# Patient Record
Sex: Male | Born: 1965 | Race: White | Hispanic: No | Smoking: Never smoker
Health system: Southern US, Community
[De-identification: ages and names within clinical notes are randomized; demographics above are authoritative.]

## PROBLEM LIST (undated history)

## (undated) DIAGNOSIS — F329 Major depressive disorder, single episode, unspecified: Secondary | ICD-10-CM

## (undated) DIAGNOSIS — F32A Depression, unspecified: Secondary | ICD-10-CM

## (undated) DIAGNOSIS — R569 Unspecified convulsions: Secondary | ICD-10-CM

## (undated) HISTORY — DX: Unspecified convulsions: R56.9

## (undated) HISTORY — PX: NO PAST SURGERIES: SHX2092

---

## 2004-04-01 ENCOUNTER — Emergency Department (HOSPITAL_COMMUNITY): Admission: EM | Admit: 2004-04-01 | Discharge: 2004-04-01 | Payer: Self-pay | Admitting: Family Medicine

## 2006-06-03 ENCOUNTER — Inpatient Hospital Stay (HOSPITAL_COMMUNITY): Admission: RE | Admit: 2006-06-03 | Discharge: 2006-06-06 | Payer: Self-pay | Admitting: Psychiatry

## 2006-06-04 ENCOUNTER — Ambulatory Visit: Payer: Self-pay | Admitting: Psychiatry

## 2006-06-25 ENCOUNTER — Ambulatory Visit (HOSPITAL_COMMUNITY): Payer: Self-pay | Admitting: Psychiatry

## 2006-07-16 ENCOUNTER — Ambulatory Visit (HOSPITAL_COMMUNITY): Payer: Self-pay | Admitting: Psychiatry

## 2006-07-30 ENCOUNTER — Ambulatory Visit (HOSPITAL_COMMUNITY): Payer: Self-pay | Admitting: Psychiatry

## 2006-09-17 ENCOUNTER — Ambulatory Visit (HOSPITAL_COMMUNITY): Payer: Self-pay | Admitting: Psychiatry

## 2011-01-15 ENCOUNTER — Other Ambulatory Visit: Payer: Self-pay | Admitting: Family Medicine

## 2011-01-15 ENCOUNTER — Ambulatory Visit
Admission: RE | Admit: 2011-01-15 | Discharge: 2011-01-15 | Disposition: A | Discharge status: Home / Self Care | Payer: BC Managed Care – PPO | Source: Ambulatory Visit | Attending: Family Medicine | Admitting: Family Medicine

## 2011-01-15 DIAGNOSIS — R05 Cough: Secondary | ICD-10-CM

## 2011-06-09 ENCOUNTER — Ambulatory Visit (HOSPITAL_COMMUNITY)
Admission: RE | Admit: 2011-06-09 | Discharge: 2011-06-09 | Disposition: A | Discharge status: Home / Self Care | Payer: BC Managed Care – PPO | Source: Ambulatory Visit | Attending: Psychiatry | Admitting: Psychiatry

## 2013-10-06 ENCOUNTER — Observation Stay (HOSPITAL_COMMUNITY)
Admission: EM | Admit: 2013-10-06 | Discharge: 2013-10-08 | Disposition: A | Discharge status: Home / Self Care | Payer: BC Managed Care – PPO | Attending: Internal Medicine | Admitting: Internal Medicine

## 2013-10-06 ENCOUNTER — Encounter (HOSPITAL_COMMUNITY): Payer: Self-pay | Admitting: Emergency Medicine

## 2013-10-06 ENCOUNTER — Emergency Department (HOSPITAL_COMMUNITY): Payer: BC Managed Care – PPO

## 2013-10-06 DIAGNOSIS — G8384 Todd's paralysis (postepileptic): Secondary | ICD-10-CM | POA: Diagnosis present

## 2013-10-06 DIAGNOSIS — M545 Low back pain, unspecified: Secondary | ICD-10-CM | POA: Diagnosis present

## 2013-10-06 DIAGNOSIS — R55 Syncope and collapse: Secondary | ICD-10-CM

## 2013-10-06 DIAGNOSIS — Z23 Encounter for immunization: Secondary | ICD-10-CM | POA: Insufficient documentation

## 2013-10-06 DIAGNOSIS — R200 Anesthesia of skin: Secondary | ICD-10-CM

## 2013-10-06 DIAGNOSIS — R569 Unspecified convulsions: Secondary | ICD-10-CM | POA: Diagnosis present

## 2013-10-06 DIAGNOSIS — R079 Chest pain, unspecified: Principal | ICD-10-CM | POA: Diagnosis present

## 2013-10-06 DIAGNOSIS — R209 Unspecified disturbances of skin sensation: Secondary | ICD-10-CM | POA: Insufficient documentation

## 2013-10-06 DIAGNOSIS — Z139 Encounter for screening, unspecified: Secondary | ICD-10-CM

## 2013-10-06 DIAGNOSIS — G8389 Other specified paralytic syndromes: Secondary | ICD-10-CM | POA: Insufficient documentation

## 2013-10-06 HISTORY — DX: Major depressive disorder, single episode, unspecified: F32.9

## 2013-10-06 HISTORY — DX: Depression, unspecified: F32.A

## 2013-10-06 LAB — POCT I-STAT, CHEM 8
HCT: 43 % (ref 39.0–52.0)
Hemoglobin: 14.6 g/dL (ref 13.0–17.0)
Potassium: 3.5 mEq/L (ref 3.5–5.1)
Sodium: 143 mEq/L (ref 135–145)
TCO2: 24 mmol/L (ref 0–100)

## 2013-10-06 LAB — HEPATIC FUNCTION PANEL
AST: 25 U/L (ref 0–37)
Albumin: 3.9 g/dL (ref 3.5–5.2)
Alkaline Phosphatase: 48 U/L (ref 39–117)
Bilirubin, Direct: 0.1 mg/dL (ref 0.0–0.3)
Total Bilirubin: 0.5 mg/dL (ref 0.3–1.2)

## 2013-10-06 LAB — TROPONIN I: Troponin I: <0.3 ng/mL (ref <0.30)

## 2013-10-06 LAB — CBC WITH DIFFERENTIAL/PLATELET
Basophils Relative: 0 % (ref 0–1)
Eosinophils Absolute: 0.3 10*3/uL (ref 0.0–0.7)
Eosinophils Relative: 2 % (ref 0–5)
Hemoglobin: 15.6 g/dL (ref 13.0–17.0)
Lymphs Abs: 1.7 10*3/uL (ref 0.7–4.0)
MCH: 32.8 pg (ref 26.0–34.0)
MCHC: 37.2 g/dL — ABNORMAL HIGH (ref 30.0–36.0)
Neutro Abs: 9 10*3/uL — ABNORMAL HIGH (ref 1.7–7.7)
Neutrophils Relative %: 77 % (ref 43–77)
Platelets: 216 10*3/uL (ref 150–400)
RBC: 4.76 MIL/uL (ref 4.22–5.81)
RDW: 12.5 % (ref 11.5–15.5)

## 2013-10-06 LAB — POCT I-STAT TROPONIN I

## 2013-10-06 LAB — PROTIME-INR
INR: 1.11 (ref 0.00–1.49)
Prothrombin Time: 14.1 seconds (ref 11.6–15.2)

## 2013-10-06 MED ORDER — SODIUM CHLORIDE 0.9 % IV SOLN
Freq: Once | INTRAVENOUS | Status: AC
  Administered 2013-10-06: 22:00:00 via INTRAVENOUS

## 2013-10-06 MED ORDER — MORPHINE SULFATE 4 MG/ML IJ SOLN
4.0000 mg | Freq: Once | INTRAMUSCULAR | Status: AC
  Administered 2013-10-06: 4 mg via INTRAVENOUS
  Filled 2013-10-06: qty 1

## 2013-10-06 MED ORDER — ONDANSETRON HCL 4 MG/2ML IJ SOLN
4.0000 mg | Freq: Once | INTRAMUSCULAR | Status: AC
  Administered 2013-10-06: 4 mg via INTRAVENOUS
  Filled 2013-10-06: qty 2
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
## 2013-10-07 ENCOUNTER — Observation Stay (HOSPITAL_COMMUNITY): Payer: BC Managed Care – PPO

## 2013-10-07 ENCOUNTER — Encounter (HOSPITAL_COMMUNITY): Payer: Self-pay | Admitting: Internal Medicine

## 2013-10-07 DIAGNOSIS — R209 Unspecified disturbances of skin sensation: Secondary | ICD-10-CM

## 2013-10-07 DIAGNOSIS — R55 Syncope and collapse: Secondary | ICD-10-CM

## 2013-10-07 DIAGNOSIS — R569 Unspecified convulsions: Secondary | ICD-10-CM | POA: Diagnosis present

## 2013-10-07 DIAGNOSIS — M545 Low back pain, unspecified: Secondary | ICD-10-CM | POA: Diagnosis present

## 2013-10-07 DIAGNOSIS — G8384 Todd's paralysis (postepileptic): Secondary | ICD-10-CM | POA: Diagnosis present

## 2013-10-07 DIAGNOSIS — R079 Chest pain, unspecified: Secondary | ICD-10-CM

## 2013-10-07 LAB — LIPID PANEL: HDL: 42 mg/dL (ref >39)

## 2013-10-07 LAB — RAPID URINE DRUG SCREEN, HOSP PERFORMED
Amphetamines: NOT DETECTED
Barbiturates: NOT DETECTED
Cocaine: NOT DETECTED
Opiates: POSITIVE — AB
Tetrahydrocannabinol: NOT DETECTED

## 2013-10-07 LAB — TROPONIN I
Troponin I: <0.3 ng/mL (ref <0.30)
Troponin I: <0.3 ng/mL (ref <0.30)

## 2013-10-07 LAB — CK: Total CK: 157 U/L (ref 7–232)

## 2013-10-07 LAB — HEMOGLOBIN A1C
Hgb A1c MFr Bld: 4.9 % (ref <5.7)
Mean Plasma Glucose: 94 mg/dL (ref <117)

## 2013-10-07 MED ORDER — INFLUENZA VAC SPLIT QUAD 0.5 ML IM SUSP
0.5000 mL | INTRAMUSCULAR | Status: AC
  Administered 2013-10-08: 0.5 mL via INTRAMUSCULAR
  Filled 2013-10-07: qty 0.5

## 2013-10-07 MED ORDER — ACETAMINOPHEN 325 MG PO TABS
650.0000 mg | ORAL_TABLET | ORAL | Status: DC | PRN
  Administered 2013-10-07: 650 mg via ORAL
  Filled 2013-10-07 (×2): qty 2

## 2013-10-07 MED ORDER — DIPHENHYDRAMINE HCL 50 MG/ML IJ SOLN: 12.5000 mg | Freq: Once | INTRAMUSCULAR | Status: DC

## 2013-10-07 MED ORDER — MORPHINE SULFATE 2 MG/ML IJ SOLN
1.0000 mg | INTRAMUSCULAR | Status: DC | PRN
  Administered 2013-10-07 (×3): 1 mg via INTRAVENOUS
  Filled 2013-10-07 (×3): qty 1

## 2013-10-07 MED ORDER — HYDROCODONE-ACETAMINOPHEN 5-325 MG PO TABS: 1.0000 | ORAL_TABLET | ORAL | Status: DC | PRN

## 2013-10-07 MED ORDER — ASPIRIN 325 MG PO TABS
325.0000 mg | ORAL_TABLET | Freq: Every day | ORAL | Status: DC
  Administered 2013-10-08: 325 mg via ORAL
  Filled 2013-10-07 (×2): qty 1

## 2013-10-07 MED ORDER — SODIUM CHLORIDE 0.9 % IV SOLN
INTRAVENOUS | Status: AC
  Administered 2013-10-07 (×2): via INTRAVENOUS

## 2013-10-07 MED ORDER — GADOBENATE DIMEGLUMINE 529 MG/ML IV SOLN
20.0000 mL | Freq: Once | INTRAVENOUS | Status: AC
  Administered 2013-10-07: 20 mL via INTRAVENOUS

## 2013-10-07 MED ORDER — KETOROLAC TROMETHAMINE 30 MG/ML IJ SOLN
30.0000 mg | Freq: Once | INTRAMUSCULAR | Status: AC
  Administered 2013-10-08: 30 mg via INTRAVENOUS
  Filled 2013-10-07: qty 1

## 2013-10-07 MED ORDER — SERTRALINE HCL 100 MG PO TABS
100.0000 mg | ORAL_TABLET | Freq: Every evening | ORAL | Status: DC
  Administered 2013-10-07: 100 mg via ORAL
  Filled 2013-10-07 (×2): qty 1

## 2013-10-07 NOTE — Progress Notes (Signed)
No further events while in the hospital. Does state he had a period of tingling in right cheek, he describes as 5 separate spots that lasted for about one hour. Currently he feels at baseline and is concerned about receiving his home dose of Zoloft. EEG has been done and reading pending. MRI pending. Will continue to monitor.   Felicie Morn PA-C [facility] [phone]  10/07/2013, 3:58 PM
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
## 2013-10-07 NOTE — Progress Notes (Signed)
SLP Cancellation Note  Patient Details Name: Christopher Middleton MRN: [id number] DOB: [birth year]   Cancelled treatment:       Reason Eval/Treat Not Completed: Patient at procedure or test/unavailable; SLP will follow up as able.  Fae Pippin, M.A., CCC-SLP [phone]  Reynard Christoffersen 10/07/2013, 10:50 AM
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
## 2013-10-07 NOTE — Progress Notes (Signed)
EEG Completed; Results Pending  
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
## 2013-10-08 DIAGNOSIS — I519 Heart disease, unspecified: Secondary | ICD-10-CM

## 2013-10-08 DIAGNOSIS — G8190 Hemiplegia, unspecified affecting unspecified side: Secondary | ICD-10-CM

## 2013-10-08 DIAGNOSIS — R569 Unspecified convulsions: Secondary | ICD-10-CM

## 2013-10-08 MED ORDER — LEVETIRACETAM 500 MG PO TABS: 500.0000 mg | ORAL_TABLET | Freq: Two times a day (BID) | ORAL | Status: DC

## 2013-10-08 MED ORDER — ASPIRIN 81 MG PO TABS: 81.0000 mg | ORAL_TABLET | Freq: Every day | ORAL | Status: DC

## 2013-10-08 MED ORDER — LEVETIRACETAM 500 MG PO TABS
500.0000 mg | ORAL_TABLET | Freq: Two times a day (BID) | ORAL | Status: DC
  Administered 2013-10-08: 500 mg via ORAL
  Filled 2013-10-08 (×2): qty 1

## 2013-10-08 NOTE — ED Provider Notes (Signed)
Medical screening examination/treatment/procedure(s) were conducted as a shared visit with non-physician practitioner(s) and myself.  I personally evaluated the patient during the encounter.  EKG Interpretation     Ventricular Rate:    PR Interval:    QRS Duration:   QT Interval:    QTC Calculation:   R Axis:     Text Interpretation:               Shon Baton, MD 10/08/13 1150
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
## 2013-10-08 NOTE — Progress Notes (Signed)
Discharge orders received, pt for discharge home today.  IV D/C. .  D/C instructions and Rx given with verbalized understanding.  Family at bedside to assist pt with discharge. Staff brought pt downstairs via wheelchair.  
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
## 2013-10-08 NOTE — Progress Notes (Signed)
Echocardiogram 2D Echocardiogram has been performed.  Dorothey Baseman 10/08/2013, 12:00 PM

## 2013-10-22 NOTE — Consult Note (Signed)
October 22, 2013    To Whom It May Concern:  This letter is to serve as verification that Quintyn Dombek was admitted for medically necessary inpatient care at [facility] from October 06, 2013 through October 08, 2013.  Mr. Angert was released to return to work on November 1 with the following restrictions.  Mr. Vandergrift is unable to drive, operate heavy machinery, perform activities at heights, or participate in water activities until he is released by an outpatient physician.  If you have any questions or need additional information, please contact my Administrative Office at [phone].  Sincerely,    Marinda Elk, M.D. [facility] [place]

## 2013-10-25 ENCOUNTER — Encounter: Payer: Self-pay | Admitting: Neurology

## 2013-10-25 ENCOUNTER — Ambulatory Visit (INDEPENDENT_AMBULATORY_CARE_PROVIDER_SITE_OTHER): Payer: BC Managed Care – PPO | Admitting: Neurology

## 2013-10-25 VITALS — BP 115/70 | HR 67 | Ht 70.0 in | Wt 194.0 lb

## 2013-10-25 DIAGNOSIS — G8384 Todd's paralysis (postepileptic): Secondary | ICD-10-CM

## 2013-10-25 DIAGNOSIS — M545 Low back pain: Secondary | ICD-10-CM

## 2013-10-25 DIAGNOSIS — R569 Unspecified convulsions: Secondary | ICD-10-CM

## 2013-10-25 DIAGNOSIS — G8190 Hemiplegia, unspecified affecting unspecified side: Secondary | ICD-10-CM

## 2013-10-25 DIAGNOSIS — R079 Chest pain, unspecified: Secondary | ICD-10-CM

## 2013-10-25 MED ORDER — LEVETIRACETAM 500 MG PO TABS: 500.0000 mg | ORAL_TABLET | Freq: Two times a day (BID) | ORAL | Status: DC
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
## 2013-10-25 NOTE — Patient Instructions (Signed)
No driving till seizure free x 6 months, seizure was in 10/06/2013.

## 2014-04-25 ENCOUNTER — Ambulatory Visit (INDEPENDENT_AMBULATORY_CARE_PROVIDER_SITE_OTHER): Payer: BC Managed Care – PPO | Admitting: Nurse Practitioner

## 2014-04-25 ENCOUNTER — Encounter: Payer: Self-pay | Admitting: Nurse Practitioner

## 2014-04-25 ENCOUNTER — Encounter (INDEPENDENT_AMBULATORY_CARE_PROVIDER_SITE_OTHER): Payer: Self-pay

## 2014-04-25 VITALS — BP 145/97 | HR 76 | Ht 70.0 in | Wt 193.0 lb

## 2014-04-25 DIAGNOSIS — G40209 Localization-related (focal) (partial) symptomatic epilepsy and epileptic syndromes with complex partial seizures, not intractable, without status epilepticus: Secondary | ICD-10-CM

## 2014-04-25 DIAGNOSIS — R569 Unspecified convulsions: Secondary | ICD-10-CM

## 2014-04-25 NOTE — Patient Instructions (Signed)
Continue Keppra 500 mg twice daily every 12 hours Call for further seizure activity Followup in 6 months

## 2014-04-25 NOTE — Progress Notes (Signed)
I have read the note, and I agree with the clinical assessment and plan.  Christopher Middleton   
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
## 2014-05-12 ENCOUNTER — Other Ambulatory Visit: Payer: Self-pay

## 2014-05-12 MED ORDER — LEVETIRACETAM 500 MG PO TABS: 500.0000 mg | ORAL_TABLET | Freq: Two times a day (BID) | ORAL | Status: DC

## 2014-10-26 ENCOUNTER — Ambulatory Visit (INDEPENDENT_AMBULATORY_CARE_PROVIDER_SITE_OTHER): Payer: BC Managed Care – PPO | Admitting: Nurse Practitioner

## 2014-10-26 ENCOUNTER — Encounter: Payer: Self-pay | Admitting: Nurse Practitioner

## 2014-10-26 VITALS — BP 112/74 | HR 69 | Ht 70.0 in | Wt 196.0 lb

## 2014-10-26 DIAGNOSIS — G40209 Localization-related (focal) (partial) symptomatic epilepsy and epileptic syndromes with complex partial seizures, not intractable, without status epilepticus: Secondary | ICD-10-CM

## 2014-10-26 DIAGNOSIS — G8384 Todd's paralysis (postepileptic): Secondary | ICD-10-CM

## 2014-10-26 MED ORDER — LEVETIRACETAM 500 MG PO TABS: 500.0000 mg | ORAL_TABLET | Freq: Two times a day (BID) | ORAL | Status: DC
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
## 2014-10-26 NOTE — Patient Instructions (Signed)
Continue Keppra 500 mg twice daily will refill Call for any seizure activity Follow-up yearly and when necessary

## 2014-11-01 ENCOUNTER — Other Ambulatory Visit: Payer: Self-pay

## 2014-11-01 ENCOUNTER — Encounter: Payer: Self-pay | Admitting: Nurse Practitioner

## 2014-11-01 MED ORDER — LEVETIRACETAM 500 MG PO TABS: 500.0000 mg | ORAL_TABLET | Freq: Two times a day (BID) | ORAL | Status: DC

## 2014-11-08 NOTE — Progress Notes (Signed)
I agree above plan. 

## 2015-10-30 ENCOUNTER — Ambulatory Visit: Payer: BC Managed Care – PPO | Admitting: Nurse Practitioner

## 2015-10-31 ENCOUNTER — Encounter: Payer: Self-pay | Admitting: Nurse Practitioner

## 2015-11-09 ENCOUNTER — Encounter: Payer: Self-pay | Admitting: Nurse Practitioner

## 2015-11-09 ENCOUNTER — Ambulatory Visit (INDEPENDENT_AMBULATORY_CARE_PROVIDER_SITE_OTHER): Payer: BLUE CROSS/BLUE SHIELD | Admitting: Nurse Practitioner

## 2015-11-09 VITALS — BP 137/85 | HR 68 | Ht 71.5 in | Wt 190.0 lb

## 2015-11-09 DIAGNOSIS — G40209 Localization-related (focal) (partial) symptomatic epilepsy and epileptic syndromes with complex partial seizures, not intractable, without status epilepticus: Secondary | ICD-10-CM | POA: Diagnosis not present

## 2015-11-09 MED ORDER — LEVETIRACETAM 500 MG PO TABS: 500.0000 mg | ORAL_TABLET | Freq: Two times a day (BID) | ORAL | Status: DC

## 2015-11-09 NOTE — Patient Instructions (Signed)
Continue Keppra at current dose will refill Call for seizure activity F/U yearly  
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
## 2015-11-09 NOTE — Progress Notes (Signed)
I have reviewed and agreed above plan. 

## 2016-04-11 ENCOUNTER — Encounter: Payer: Self-pay | Admitting: Pulmonary Disease

## 2016-04-11 ENCOUNTER — Ambulatory Visit (INDEPENDENT_AMBULATORY_CARE_PROVIDER_SITE_OTHER): Payer: BLUE CROSS/BLUE SHIELD | Admitting: Pulmonary Disease

## 2016-04-11 VITALS — BP 118/78 | HR 81 | Ht 71.5 in | Wt 197.0 lb

## 2016-04-11 DIAGNOSIS — G4733 Obstructive sleep apnea (adult) (pediatric): Secondary | ICD-10-CM | POA: Diagnosis not present

## 2016-04-11 DIAGNOSIS — G4710 Hypersomnia, unspecified: Secondary | ICD-10-CM | POA: Diagnosis not present

## 2016-04-11 NOTE — Assessment & Plan Note (Addendum)
Pt has hypersomnia, snoring.  Has gasping, choking, witnessed apneas, frequent awakenings.  Sleeps for 7 hrs.  Has unrefreshed sleep. Has hypersomnia. Hypersomnia affects fxnality.  (-) abnormal behavior in sleep.   He had a sleep study in 2011 which showed AHI < 1, REM AHI 8. He did not have a good experience with the sleep study. Difficulty falling and staying asleep.   ESS 12.  Very symptomatic at work.   Plan : 1. HST. Likely with moderate-severe OSA. If (+), will try autocpap. Anticipate no issues with cpap. 2. Father in law uses mouth guard. We discussed about this. Another option if ever. 3. If sleep study is (-), will need to see sooner. Pt is on sze meds which can cause hypersomnia. Pt also may have anxiety issues > not sure. Can affect hypersomnia.

## 2016-04-11 NOTE — Patient Instructions (Signed)
1. We will order you a home sleep study. If that is positive, we will start you on auto CPAP 5-15 cm water. 2. Give us a call if you don't hear back from us regarding study report and CPAP.  Return to clinic in August.

## 2016-04-11 NOTE — Progress Notes (Signed)
Subjective:    Patient ID: Christopher Middleton, male    DOB: [birth year], 50 y.o.   MRN: [id number]  HPI   This is the case of Christopher Middleton, 50 y.o. Male, who was referred by Dr. Blair Heys  in consultation regarding hypersomnia.   As you very well know, patient is a non smoker. Smokes cigars.  Not been dxed with asthma or copd. Has sze d/o since 2015. Stable. On meds. Sees neurology.   Pt has hypersomnia, snoring.  Has gasping, choking, witnessed apneas, frequent awakenings.  Sleeps for 7 hrs.  Has unrefreshed sleep. Has hypersomnia. Hypersomnia affects fxnality.  (-) abnormal behavior in sleep.   He had a sleep study in 2011. AHI was <1, REM AHI 8.  Pt had difficulty staying falling and staying asleep during that sleep study.  He did NOT have a good experience with the lab study.         Review of Systems  Constitutional: Negative.  Negative for fever and unexpected weight change.  HENT: Positive for congestion, postnasal drip and sinus pressure. Negative for dental problem, ear pain, nosebleeds, rhinorrhea, sneezing, sore throat and trouble swallowing.   Eyes: Positive for redness. Negative for itching.  Respiratory: Positive for cough. Negative for chest tightness, shortness of breath and wheezing.   Cardiovascular: Negative.  Negative for palpitations and leg swelling.  Gastrointestinal: Negative.  Negative for nausea and vomiting.  Endocrine: Negative.   Genitourinary: Negative.  Negative for dysuria.  Musculoskeletal: Positive for arthralgias. Negative for joint swelling.  Skin: Negative.  Negative for rash.  Allergic/Immunologic: Negative.   Neurological: Negative.  Negative for headaches.  Hematological: Negative.  Does not bruise/bleed easily.  Psychiatric/Behavioral: Negative.  Negative for dysphoric mood. The patient is not nervous/anxious.    Past Medical History  Diagnosis Date  . Depression   . Seizures (HCC)    (-) CA, DVT  Family History    Problem Relation Age of Onset  . Diabetes Mellitus II Mother   . Prostate cancer Paternal Grandfather   . Stroke Maternal Grandfather      Past Surgical History  Procedure Laterality Date  . No past surgeries      Social History   Social History  . Marital Status: Married    Spouse Name: Aggie Cosier  . Number of Children: 3  . Years of Education: 16+   Occupational History  . Not on file.   Social History Main Topics  . Smoking status: Never Smoker   . Smokeless tobacco: Never Used  . Alcohol Use: 0.0 oz/week    0 Standard drinks or equivalent per week     Comment: 3-4 week  . Drug Use: No  . Sexual Activity: Not on file   Other Topics Concern  . Not on file   Social History Narrative   Patient lives at home with his wife Aggie Cosier) and 3 children.   Patient works at [employer].   Patient has a Masters   Patient is ambi-dextrous.   Patient drinks 1-2 cups of coffee daily.  Drinks etoh occasionally.  Does desk job. Gets sleepy at work.     No Known Allergies   Outpatient Prescriptions Prior to Visit  Medication Sig Dispense Refill  . aspirin 81 MG tablet Take 1 tablet (81 mg total) by mouth daily. 30 tablet 0  . levETIRAcetam (KEPPRA) 500 MG tablet Take 1 tablet (500 mg total) by mouth 2 (two) times daily. 180 tablet 3  . sertraline (ZOLOFT) 100  MG tablet Take 100 mg by mouth every evening.     No facility-administered medications prior to visit.   No orders of the defined types were placed in this encounter.          Objective:   Physical Exam  Vitals:  Filed Vitals:   04/11/16 1109  BP: 118/78  Pulse: 81  Height: 5' 11.5" (1.816 m)  Weight: 197 lb (89.359 kg)  SpO2: 96%    Constitutional/General:  Pleasant, well-nourished, well-developed, not in any distress,  Comfortably seating.  Well kempt  Body mass index is 27.1 kg/(m^2). Wt Readings from Last 3 Encounters:  04/11/16 197 lb (89.359 kg)  11/09/15 190 lb (86.183 kg)  10/26/14 196 lb (88.905  kg)    Neck circumference: 16 inches  HEENT: Pupils equal and reactive to light and accommodation. Anicteric sclerae. Normal nasal mucosa.   No oral  lesions,  mouth clear,  oropharynx clear, no postnasal drip. (-) Oral thrush. No dental caries.  Airway - Mallampati class III  Neck: No masses. Midline trachea. No JVD, (-) LAD. (-) bruits appreciated.  Respiratory/Chest: Grossly normal chest. (-) deformity. (-) Accessory muscle use.  Symmetric expansion. (-) Tenderness on palpation.  Resonant on percussion.  Diminished BS on both lower lung zones. (-) wheezing, crackles, rhonchi (-) egophony  Cardiovascular: Regular rate and  rhythm, heart sounds normal, no murmur or gallops, no peripheral edema  Gastrointestinal:  Normal bowel sounds. Soft, non-tender. No hepatosplenomegaly.  (-) masses.   Musculoskeletal:  Normal muscle tone. Normal gait.   Extremities: Grossly normal. (-) clubbing, cyanosis.  (-) edema  Skin: (-) rash,lesions seen.   Neurological/Psychiatric : alert, oriented to time, place, person. Normal mood and affect            Assessment & Plan:  Hypersomnia Pt has hypersomnia, snoring.  Has gasping, choking, witnessed apneas, frequent awakenings.  Sleeps for 7 hrs.  Has unrefreshed sleep. Has hypersomnia. Hypersomnia affects fxnality.  (-) abnormal behavior in sleep.   He had a sleep study in 2011 which showed AHI < 1, REM AHI 8. He did not have a good experience with the sleep study. Difficulty falling and staying asleep.   ESS 12.  Very symptomatic at work.   Plan : 1. HST. Likely with moderate-severe OSA. If (+), will try autocpap. Anticipate no issues with cpap. 2. Father in law uses mouth guard. We discussed about this. Another option if ever. 3. If sleep study is (-), will need to see sooner. Pt is on sze meds which can cause hypersomnia.        Thank you very much for letting me participate in this patient's care. Please do not  hesitate to give me a call if you have any questions or concerns regarding the treatment plan.   Patient will follow up with me in August, sooner if with issues.     Pollie MeyerJ. Angelo A. de Dios, MD 04/11/2016   12:16 PM Pulmonary and Critical Care Medicine [facility] Pager: [phone] Office: [phone], Fax: [phone]

## 2016-05-02 ENCOUNTER — Encounter: Payer: Self-pay | Admitting: Pulmonary Disease

## 2016-05-07 DIAGNOSIS — G4733 Obstructive sleep apnea (adult) (pediatric): Secondary | ICD-10-CM | POA: Diagnosis not present

## 2016-05-16 ENCOUNTER — Telehealth: Payer: Self-pay | Admitting: Pulmonary Disease

## 2016-05-16 DIAGNOSIS — G4733 Obstructive sleep apnea (adult) (pediatric): Secondary | ICD-10-CM

## 2016-05-16 NOTE — Telephone Encounter (Signed)
  Please call the pt and tell the pt the HOME SLEEP STUDY  showed OSA.   Pt stops breathing  17  times an hour.   Please order autoCPAP 5-15 cm H2O. Patient will need a mask fitting session. Patient will need a 1 month download.   Patient needs to be seen by me (if possible)  or any of the NPs/APPs  6-8  weeks after obtaining the cpap machine. Let me know if you receive this.   Thanks!   J. Alexis FrockAngelo A de Dios, MD 05/16/2016, 4:11 PM

## 2016-05-17 ENCOUNTER — Other Ambulatory Visit: Payer: Self-pay | Admitting: *Deleted

## 2016-05-17 DIAGNOSIS — G4733 Obstructive sleep apnea (adult) (pediatric): Secondary | ICD-10-CM

## 2016-05-27 NOTE — Telephone Encounter (Signed)
Spoke with pt and gave results and recommendations. Pt agrees to start CPAP therapy. Order placed. Pt aware to call office and schedule f/u appt once starts CPAP. Nothing further needed.  

## 2016-06-17 ENCOUNTER — Encounter (HOSPITAL_COMMUNITY): Payer: Self-pay | Admitting: *Deleted

## 2016-06-17 ENCOUNTER — Emergency Department (HOSPITAL_COMMUNITY)
Admission: EM | Admit: 2016-06-17 | Discharge: 2016-06-17 | Disposition: A | Discharge status: Home / Self Care | Payer: BLUE CROSS/BLUE SHIELD | Attending: Emergency Medicine | Admitting: Emergency Medicine

## 2016-06-17 DIAGNOSIS — R51 Headache: Secondary | ICD-10-CM | POA: Insufficient documentation

## 2016-06-17 DIAGNOSIS — Z791 Long term (current) use of non-steroidal anti-inflammatories (NSAID): Secondary | ICD-10-CM | POA: Diagnosis not present

## 2016-06-17 DIAGNOSIS — R110 Nausea: Secondary | ICD-10-CM | POA: Insufficient documentation

## 2016-06-17 DIAGNOSIS — Z79899 Other long term (current) drug therapy: Secondary | ICD-10-CM | POA: Insufficient documentation

## 2016-06-17 DIAGNOSIS — F329 Major depressive disorder, single episode, unspecified: Secondary | ICD-10-CM | POA: Diagnosis not present

## 2016-06-17 DIAGNOSIS — R197 Diarrhea, unspecified: Secondary | ICD-10-CM | POA: Diagnosis not present

## 2016-06-17 DIAGNOSIS — R109 Unspecified abdominal pain: Secondary | ICD-10-CM | POA: Diagnosis not present

## 2016-06-17 DIAGNOSIS — Z7982 Long term (current) use of aspirin: Secondary | ICD-10-CM | POA: Diagnosis not present

## 2016-06-17 LAB — GASTROINTESTINAL PANEL BY PCR, STOOL (REPLACES STOOL CULTURE)
Adenovirus F40/41: NOT DETECTED
Astrovirus: NOT DETECTED
CRYPTOSPORIDIUM: NOT DETECTED
CYCLOSPORA CAYETANENSIS: NOT DETECTED
Campylobacter species: NOT DETECTED
E. COLI O157: NOT DETECTED
ENTAMOEBA HISTOLYTICA: NOT DETECTED
ENTEROAGGREGATIVE E COLI (EAEC): NOT DETECTED
Enteropathogenic E coli (EPEC): DETECTED — AB
Enterotoxigenic E coli (ETEC): DETECTED — AB
GIARDIA LAMBLIA: NOT DETECTED
NOROVIRUS GI/GII: NOT DETECTED
Plesimonas shigelloides: NOT DETECTED
Rotavirus A: NOT DETECTED
SALMONELLA SPECIES: NOT DETECTED
SAPOVIRUS (I, II, IV, AND V): NOT DETECTED
SHIGELLA/ENTEROINVASIVE E COLI (EIEC): NOT DETECTED
Shiga like toxin producing E coli (STEC): NOT DETECTED
VIBRIO CHOLERAE: NOT DETECTED
VIBRIO SPECIES: NOT DETECTED
YERSINIA ENTEROCOLITICA: NOT DETECTED

## 2016-06-17 LAB — URINE MICROSCOPIC-ADD ON

## 2016-06-17 LAB — COMPREHENSIVE METABOLIC PANEL
ALT: 33 U/L (ref 17–63)
AST: 24 U/L (ref 15–41)
Albumin: 4.9 g/dL (ref 3.5–5.0)
Alkaline Phosphatase: 66 U/L (ref 38–126)
Anion gap: 9 (ref 5–15)
BUN: 17 mg/dL (ref 6–20)
CHLORIDE: 104 mmol/L (ref 101–111)
CO2: 26 mmol/L (ref 22–32)
CREATININE: 1.42 mg/dL — AB (ref 0.61–1.24)
Calcium: 9.3 mg/dL (ref 8.9–10.3)
GFR calc non Af Amer: 56 mL/min — ABNORMAL LOW (ref >60)
Glucose, Bld: 103 mg/dL — ABNORMAL HIGH (ref 65–99)
POTASSIUM: 4 mmol/L (ref 3.5–5.1)
SODIUM: 139 mmol/L (ref 135–145)
Total Bilirubin: 1.2 mg/dL (ref 0.3–1.2)
Total Protein: 8.6 g/dL — ABNORMAL HIGH (ref 6.5–8.1)

## 2016-06-17 LAB — URINALYSIS, ROUTINE W REFLEX MICROSCOPIC
GLUCOSE, UA: NEGATIVE mg/dL
HGB URINE DIPSTICK: NEGATIVE
Ketones, ur: NEGATIVE mg/dL
Nitrite: NEGATIVE
PH: 5.5 (ref 5.0–8.0)
PROTEIN: 30 mg/dL — AB
SPECIFIC GRAVITY, URINE: 1.029 (ref 1.005–1.030)

## 2016-06-17 LAB — CBC
HEMATOCRIT: 44.8 % (ref 39.0–52.0)
Hemoglobin: 16.9 g/dL (ref 13.0–17.0)
MCH: 32.3 pg (ref 26.0–34.0)
MCHC: 37.7 g/dL — ABNORMAL HIGH (ref 30.0–36.0)
MCV: 85.5 fL (ref 78.0–100.0)
PLATELETS: 183 10*3/uL (ref 150–400)
RBC: 5.24 MIL/uL (ref 4.22–5.81)
RDW: 13 % (ref 11.5–15.5)
WBC: 13.3 10*3/uL — AB (ref 4.0–10.5)

## 2016-06-17 LAB — LIPASE, BLOOD: LIPASE: 26 U/L (ref 11–51)

## 2016-06-17 MED ORDER — SODIUM CHLORIDE 0.9 % IV BOLUS (SEPSIS)
2000.0000 mL | Freq: Once | INTRAVENOUS | Status: AC
  Administered 2016-06-17: 2000 mL via INTRAVENOUS

## 2016-06-17 MED ORDER — ONDANSETRON HCL 4 MG/2ML IJ SOLN
4.0000 mg | Freq: Four times a day (QID) | INTRAMUSCULAR | Status: DC | PRN
  Administered 2016-06-17: 4 mg via INTRAVENOUS

## 2016-06-17 MED ORDER — DICYCLOMINE HCL 20 MG PO TABS: 20.0000 mg | ORAL_TABLET | Freq: Two times a day (BID) | ORAL | Status: DC | PRN

## 2016-06-17 MED ORDER — DICYCLOMINE HCL 10 MG/ML IM SOLN
20.0000 mg | Freq: Once | INTRAMUSCULAR | Status: AC
  Administered 2016-06-17: 20 mg via INTRAMUSCULAR
  Filled 2016-06-17: qty 2

## 2016-06-17 MED ORDER — SODIUM CHLORIDE 0.9 % IV BOLUS (SEPSIS)
1000.0000 mL | Freq: Once | INTRAVENOUS | Status: AC
  Administered 2016-06-17: 1000 mL via INTRAVENOUS

## 2016-06-17 MED ORDER — METRONIDAZOLE IN NACL 5-0.79 MG/ML-% IV SOLN
500.0000 mg | Freq: Once | INTRAVENOUS | Status: AC
  Administered 2016-06-17: 500 mg via INTRAVENOUS
  Filled 2016-06-17: qty 100

## 2016-06-17 MED ORDER — ONDANSETRON HCL 4 MG/2ML IJ SOLN
4.0000 mg | Freq: Once | INTRAMUSCULAR | Status: DC | PRN
  Filled 2016-06-17: qty 2

## 2016-06-17 MED ORDER — OXYCODONE-ACETAMINOPHEN 5-325 MG PO TABS: 1.0000 | ORAL_TABLET | Freq: Four times a day (QID) | ORAL | Status: DC | PRN

## 2016-06-17 MED ORDER — METRONIDAZOLE 500 MG PO TABS: 500.0000 mg | ORAL_TABLET | Freq: Three times a day (TID) | ORAL | Status: DC

## 2016-06-17 MED ORDER — HYDROMORPHONE HCL 1 MG/ML IJ SOLN
0.5000 mg | Freq: Once | INTRAMUSCULAR | Status: AC
  Administered 2016-06-17: 0.5 mg via INTRAVENOUS
  Filled 2016-06-17: qty 1

## 2016-06-17 MED ORDER — CIPROFLOXACIN IN D5W 400 MG/200ML IV SOLN
400.0000 mg | Freq: Once | INTRAVENOUS | Status: AC
Start: 2016-06-17 — End: 2016-06-17
  Administered 2016-06-17: 400 mg via INTRAVENOUS
  Filled 2016-06-17: qty 200

## 2016-06-17 MED ORDER — CIPROFLOXACIN HCL 500 MG PO TABS: 500.0000 mg | ORAL_TABLET | Freq: Two times a day (BID) | ORAL | Status: DC

## 2016-06-17 NOTE — ED Provider Notes (Signed)
CSN: [id number]     Arrival date & time 06/17/16  1308 History   First MD Initiated Contact with Patient 06/17/16 [phone]     Chief Complaint  Patient presents with  . Emesis  . Diarrhea     (Consider location/radiation/quality/duration/timing/severity/associated sxs/prior Treatment) HPI Comments: 50 year old male with history of depression, seizures presents for diarrhea and abdominal pain. The patient returned from [place] about 4 days ago. He said while he was in [place] for a couple weeks he had been having loose stools but not frequent stools and denied any pain at that time. He said on Thursday the day before he came back he started to note some cramping abdominal pain. This seemed to get worse since he's returned home. He's been having very frequent, copious loose stools. This has been associated with severe cramping abdominal pain. He denies any blood in his stool. This morning he also had dry heaving. He denies actually vomiting. His symptoms have not been associated with vomiting over the last few days. He feels like anything he tries to eat or drink goes right through him and his stomach feels sick so he has not wanted to eat or drink much. Denies known fever but has been feeling hot and cold. Of note he did not sleep for 40 hours straight while returning to the [place].   Past Medical History  Diagnosis Date  . Depression   . Seizures [facility])    Past Surgical History  Procedure Laterality Date  . No past surgeries     Family History  Problem Relation Age of Onset  . Diabetes Mellitus II Mother   . Prostate cancer Paternal Grandfather   . Stroke Maternal Grandfather    Social History  Substance Use Topics  . Smoking status: Never Smoker   . Smokeless tobacco: Never Used  . Alcohol Use: 0.0 oz/week    0 Standard drinks or equivalent per week     Comment: 3-4 week    Review of Systems  Constitutional: Positive for fatigue. Negative for fever and chills.  HENT: Negative  for congestion, rhinorrhea and sinus pressure.   Eyes: Negative for visual disturbance.  Respiratory: Negative for chest tightness and shortness of breath.   Cardiovascular: Negative for chest pain and palpitations.  Gastrointestinal: Positive for nausea, abdominal pain and diarrhea. Negative for vomiting.  Genitourinary: Negative for dysuria and hematuria.  Musculoskeletal: Negative for myalgias and back pain.  Skin: Negative for rash.  Neurological: Positive for headaches. Negative for dizziness, seizures, weakness and numbness.  Hematological: Does not bruise/bleed easily.      Allergies  Review of patient's allergies indicates no known allergies.  Home Medications   Prior to Admission medications   Medication Sig Start Date End Date Taking? Authorizing Provider  aspirin 81 MG tablet Take 1 tablet (81 mg total) by mouth daily. Patient taking differently: Take 81 mg by mouth daily as needed for pain.  10/08/13  Yes Marinda Elk, MD  ibuprofen (ADVIL,MOTRIN) 200 MG tablet Take 400 mg by mouth every 6 (six) hours as needed for headache or moderate pain.   Yes Historical Provider, MD  levETIRAcetam (KEPPRA) 500 MG tablet Take 1 tablet (500 mg total) by mouth 2 (two) times daily. 11/09/15  Yes Nilda Riggs, NP  Multiple Vitamin (MULTIVITAMIN WITH MINERALS) TABS tablet Take 1 tablet by mouth daily.   Yes Historical Provider, MD  sertraline (ZOLOFT) 100 MG tablet Take 100 mg by mouth every evening.   Yes Historical Provider,  MD  ciprofloxacin (CIPRO) 500 MG tablet Take 1 tablet (500 mg total) by mouth 2 (two) times daily. 06/17/16   Leta BaptistEmily Roe Nikia Mangino, MD  dicyclomine (BENTYL) 20 MG tablet Take 1 tablet (20 mg total) by mouth 2 (two) times daily as needed for spasms. 06/17/16   Leta BaptistEmily Roe Leilynn Pilat, MD  metroNIDAZOLE (FLAGYL) 500 MG tablet Take 1 tablet (500 mg total) by mouth 3 (three) times daily. 06/17/16   Leta BaptistEmily Roe Carys Malina, MD  oxyCODONE-acetaminophen (PERCOCET/ROXICET) 5-325 MG  tablet Take 1-2 tablets by mouth every 6 (six) hours as needed for severe pain. 06/17/16   Leta BaptistEmily Roe Anissa Abbs, MD   BP 127/86 mmHg  Pulse 87  Temp(Src) 98.6 F (37 C) (Oral)  Resp 16  Ht 6' (1.829 m)  Wt 194 lb (87.998 kg)  BMI 26.31 kg/m2  SpO2 98% Physical Exam  Constitutional: He is oriented to person, place, and time. He appears well-developed and well-nourished. No distress.  HENT:  Head: Normocephalic and atraumatic.  Right Ear: External ear normal.  Left Ear: External ear normal.  Mouth/Throat: Oropharynx is clear and moist. Mucous membranes are dry. No oropharyngeal exudate.  Eyes: EOM are normal. Pupils are equal, round, and reactive to light.  Neck: Normal range of motion. Neck supple.  Cardiovascular: Normal rate, regular rhythm, normal heart sounds and intact distal pulses.   No murmur heard. Pulmonary/Chest: Effort normal. No respiratory distress. He has no wheezes. He has no rales.  Abdominal: Soft. He exhibits no distension. There is tenderness (generalized, diffuse).  Musculoskeletal: He exhibits no edema.  Neurological: He is alert and oriented to person, place, and time.  Skin: Skin is warm and dry. No rash noted. He is not diaphoretic.  Vitals reviewed.   ED Course  Procedures (including critical care time) Labs Review Labs Reviewed  GASTROINTESTINAL PANEL BY PCR, STOOL (REPLACES STOOL CULTURE) - Abnormal; Notable for the following:    Enteropathogenic E coli (EPEC) DETECTED (*)    Enterotoxigenic E coli (ETEC) DETECTED (*)    All other components within normal limits  COMPREHENSIVE METABOLIC PANEL - Abnormal; Notable for the following:    Glucose, Bld 103 (*)    Creatinine, Ser 1.42 (*)    Total Protein 8.6 (*)    GFR calc non Af Amer 56 (*)    All other components within normal limits  CBC - Abnormal; Notable for the following:    WBC 13.3 (*)    MCHC 37.7 (*)    All other components within normal limits  URINALYSIS, ROUTINE W REFLEX MICROSCOPIC (NOT  AT [facility]) - Abnormal; Notable for the following:    Color, Urine ORANGE (*)    APPearance CLOUDY (*)    Bilirubin Urine SMALL (*)    Protein, ur 30 (*)    Leukocytes, UA TRACE (*)    All other components within normal limits  URINE MICROSCOPIC-ADD ON - Abnormal; Notable for the following:    Squamous Epithelial / LPF 0-5 (*)    Bacteria, UA MANY (*)    Casts HYALINE CASTS (*)    All other components within normal limits  URINE CULTURE  LIPASE, BLOOD    Imaging Review No results found. I have personally reviewed and evaluated these images and lab results as part of my medical decision-making.   EKG Interpretation None      MDM  Patient was seen and evaluated in stable condition.  Patient with borderline temperature.  Normal blood pressure and heart rate.  Has generalized abdominal tenderness  on examination.  Patient hydrated with 3 L IV fluids and reported great improvement.  Also treated with Bentyl and dilaudid for pain control.  Continued to have loose stools in the ER but was able to tolerate drinking three cups of fluids.  He appeared stable enough for discharge and was given an initial dose of Cipro/flagyl in the ED for presumed infectious diarrhea.  He was discharged home with prescriptions for cipro/flagyl as well as percocet and bentyl.  He was given strict return precautions and instructed to follow up with his PCP tomorrow or the next day.  Patient and wife expressed understanding and agreement with plan of care. Final diagnoses:  Acute diarrhea    1. Acute diarrhea    Leta Baptist, MD 06/17/16 2202

## 2016-06-17 NOTE — ED Notes (Signed)
All IV antibiotics and fluids are completed at this time.  EPIC will not allow RN to "stop"

## 2016-06-17 NOTE — ED Notes (Signed)
PT unable to give stool sample at this time

## 2016-06-17 NOTE — Discharge Instructions (Signed)
You were seen and evaluated today for your diarrhea. Likely this is an infectious process. Please take the antibiotics prescribed. Follow-up with your primary care doctor tomorrow or the next day. Your stool cultures will hopefully have resulted by then.  Diarrhea Diarrhea is frequent loose and watery bowel movements. It can cause you to feel weak and dehydrated. Dehydration can cause you to become tired and thirsty, have a dry mouth, and have decreased urination that often is dark yellow. Diarrhea is a sign of another problem, most often an infection that will not last long. In most cases, diarrhea typically lasts 2-3 days. However, it can last longer if it is a sign of something more serious. It is important to treat your diarrhea as directed by your caregiver to lessen or prevent future episodes of diarrhea. CAUSES  Some common causes include:  Gastrointestinal infections caused by viruses, bacteria, or parasites.  Food poisoning or food allergies.  Certain medicines, such as antibiotics, chemotherapy, and laxatives.  Artificial sweeteners and fructose.  Digestive disorders. HOME CARE INSTRUCTIONS  Ensure adequate fluid intake (hydration): Have 1 cup (8 oz) of fluid for each diarrhea episode. Avoid fluids that contain simple sugars or sports drinks, fruit juices, whole milk products, and sodas. Your urine should be clear or pale yellow if you are drinking enough fluids. Hydrate with an oral rehydration solution that you can purchase at pharmacies, retail stores, and online. You can prepare an oral rehydration solution at home by mixing the following ingredients together:   - tsp table salt.   tsp baking soda.   tsp salt substitute containing potassium chloride.  1  tablespoons sugar.  1 L (34 oz) of water.  Certain foods and beverages may increase the speed at which food moves through the gastrointestinal (GI) tract. These foods and beverages should be avoided and  include:  Caffeinated and alcoholic beverages.  High-fiber foods, such as raw fruits and vegetables, nuts, seeds, and whole grain breads and cereals.  Foods and beverages sweetened with sugar alcohols, such as xylitol, sorbitol, and mannitol.  Some foods may be well tolerated and may help thicken stool including:  Starchy foods, such as rice, toast, pasta, low-sugar cereal, oatmeal, grits, baked potatoes, crackers, and bagels.  Bananas.  Applesauce.  Add probiotic-rich foods to help increase healthy bacteria in the GI tract, such as yogurt and fermented milk products.  Wash your hands well after each diarrhea episode.  Only take over-the-counter or prescription medicines as directed by your caregiver.  Take a warm bath to relieve any burning or pain from frequent diarrhea episodes. SEEK IMMEDIATE MEDICAL CARE IF:   You are unable to keep fluids down.  You have persistent vomiting.  You have blood in your stool, or your stools are black and tarry.  You do not urinate in 6-8 hours, or there is only a small amount of very dark urine.  You have abdominal pain that increases or localizes.  You have weakness, dizziness, confusion, or light-headedness.  You have a severe headache.  Your diarrhea gets worse or does not get better.  You have a fever or persistent symptoms for more than 2-3 days.  You have a fever and your symptoms suddenly get worse. MAKE SURE YOU:   Understand these instructions.  Will watch your condition.  Will get help right away if you are not doing well or get worse.   This information is not intended to replace advice given to you by your health care provider. Make sure  you discuss any questions you have with your health care provider.   Document Released: 11/15/2002 Document Revised: 12/16/2014 Document Reviewed: 08/02/2012 Elsevier Interactive Patient Education [employer]2016 Elsevier Inc.

## 2016-06-17 NOTE — ED Notes (Signed)
PT have been made aware of urine and stool sample. Will info staff when able to go

## 2016-06-17 NOTE — ED Notes (Signed)
PT states that he returned from [place] on Fri; pt c/o N/V/D since returning; pt states that he is unable to keep any fluids down; pt reports numerous episodes of diarrhea; pt also c/o headache and generalized malaise

## 2016-06-17 NOTE — ED Notes (Signed)
MD at bedside. 

## 2016-06-17 NOTE — ED Notes (Signed)
Went to draw labwork. Unable to draw labs as MD in middle of assessment.

## 2016-06-18 LAB — URINE CULTURE

## 2016-07-12 ENCOUNTER — Ambulatory Visit: Payer: BLUE CROSS/BLUE SHIELD | Admitting: Pulmonary Disease

## 2016-09-21 DIAGNOSIS — G4733 Obstructive sleep apnea (adult) (pediatric): Secondary | ICD-10-CM | POA: Diagnosis not present

## 2016-10-22 DIAGNOSIS — G4733 Obstructive sleep apnea (adult) (pediatric): Secondary | ICD-10-CM | POA: Diagnosis not present

## 2016-11-11 ENCOUNTER — Ambulatory Visit: Payer: BLUE CROSS/BLUE SHIELD | Admitting: Nurse Practitioner

## 2016-12-06 ENCOUNTER — Ambulatory Visit (INDEPENDENT_AMBULATORY_CARE_PROVIDER_SITE_OTHER): Payer: BLUE CROSS/BLUE SHIELD | Admitting: Nurse Practitioner

## 2016-12-06 ENCOUNTER — Encounter: Payer: Self-pay | Admitting: Nurse Practitioner

## 2016-12-06 VITALS — BP 125/83 | HR 63 | Ht 72.0 in | Wt 203.6 lb

## 2016-12-06 DIAGNOSIS — G40209 Localization-related (focal) (partial) symptomatic epilepsy and epileptic syndromes with complex partial seizures, not intractable, without status epilepticus: Secondary | ICD-10-CM

## 2016-12-06 MED ORDER — LEVETIRACETAM 500 MG PO TABS: 500.0000 mg | ORAL_TABLET | Freq: Two times a day (BID) | ORAL | 3 refills | Status: DC

## 2016-12-06 NOTE — Patient Instructions (Signed)
Continue Keppra at current dose will refill Call for seizure activity F/U yearly  
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
## 2016-12-12 ENCOUNTER — Other Ambulatory Visit: Payer: Self-pay | Admitting: *Deleted

## 2016-12-12 MED ORDER — LEVETIRACETAM 500 MG PO TABS: 500.0000 mg | ORAL_TABLET | Freq: Two times a day (BID) | ORAL | 3 refills | Status: DC

## 2016-12-12 NOTE — Telephone Encounter (Signed)
Received fax asking for additional information and or clarification.  Unable to verify eligibility.   Called pt and relayed that received this, no change unless they have different address.  Called and spoke to [facility] they did not have recent prescription.  I will redo.

## 2016-12-22 DIAGNOSIS — G4733 Obstructive sleep apnea (adult) (pediatric): Secondary | ICD-10-CM | POA: Diagnosis not present

## 2016-12-24 MED ORDER — LEVETIRACETAM 500 MG PO TABS: 500.0000 mg | ORAL_TABLET | Freq: Two times a day (BID) | ORAL | 3 refills | Status: DC

## 2016-12-24 NOTE — Addendum Note (Signed)
Addended byHermenia Fiscal: Javed Cotto on: 12/24/2016 05:18 PM   Modules accepted: Orders

## 2016-12-24 NOTE — Telephone Encounter (Signed)
I called [facility] and they did not have prescription.  I will redo again.  I told pt to call them and verify his address (give new) and update information.

## 2016-12-25 ENCOUNTER — Other Ambulatory Visit: Payer: Self-pay | Admitting: Nurse Practitioner

## 2017-01-02 ENCOUNTER — Other Ambulatory Visit: Payer: Self-pay | Admitting: *Deleted

## 2017-01-02 ENCOUNTER — Telehealth: Payer: Self-pay | Admitting: *Deleted

## 2017-01-02 MED ORDER — LEVETIRACETAM 500 MG PO TABS: 500.0000 mg | ORAL_TABLET | Freq: Two times a day (BID) | ORAL | 3 refills | Status: DC

## 2017-01-02 NOTE — Telephone Encounter (Signed)
[facility] called and wanted to approve refill on pt keppra, generic. Spoke with pharm tech, Shawntierre Advised CM,NP already sent rx keppra on 01/24/17 qty 180 3 refills. They never received this. I re-sent as requested while tech on phone and she verified they received while I was on the phone. Nothing further needed at this time.

## 2017-01-28 ENCOUNTER — Emergency Department (HOSPITAL_COMMUNITY): Payer: BLUE CROSS/BLUE SHIELD

## 2017-01-28 ENCOUNTER — Encounter (HOSPITAL_COMMUNITY): Payer: Self-pay | Admitting: Emergency Medicine

## 2017-01-28 ENCOUNTER — Emergency Department (HOSPITAL_COMMUNITY)
Admission: EM | Admit: 2017-01-28 | Discharge: 2017-01-28 | Disposition: A | Discharge status: Home / Self Care | Payer: BLUE CROSS/BLUE SHIELD | Attending: Emergency Medicine | Admitting: Emergency Medicine

## 2017-01-28 DIAGNOSIS — Z79899 Other long term (current) drug therapy: Secondary | ICD-10-CM | POA: Insufficient documentation

## 2017-01-28 DIAGNOSIS — Z7982 Long term (current) use of aspirin: Secondary | ICD-10-CM | POA: Insufficient documentation

## 2017-01-28 DIAGNOSIS — N200 Calculus of kidney: Secondary | ICD-10-CM | POA: Diagnosis not present

## 2017-01-28 DIAGNOSIS — R079 Chest pain, unspecified: Secondary | ICD-10-CM | POA: Diagnosis not present

## 2017-01-28 DIAGNOSIS — R0789 Other chest pain: Secondary | ICD-10-CM | POA: Diagnosis not present

## 2017-01-28 LAB — COMPREHENSIVE METABOLIC PANEL
ALK PHOS: 51 U/L (ref 38–126)
ALT: 47 U/L (ref 17–63)
ANION GAP: 8 (ref 5–15)
AST: 29 U/L (ref 15–41)
Albumin: 4.7 g/dL (ref 3.5–5.0)
BUN: 24 mg/dL — ABNORMAL HIGH (ref 6–20)
CALCIUM: 9.3 mg/dL (ref 8.9–10.3)
CO2: 25 mmol/L (ref 22–32)
Chloride: 107 mmol/L (ref 101–111)
Creatinine, Ser: 1.2 mg/dL (ref 0.61–1.24)
GFR calc Af Amer: >60 mL/min (ref >60)
GFR calc non Af Amer: >60 mL/min (ref >60)
Glucose, Bld: 105 mg/dL — ABNORMAL HIGH (ref 65–99)
POTASSIUM: 3.9 mmol/L (ref 3.5–5.1)
SODIUM: 140 mmol/L (ref 135–145)
Total Bilirubin: 0.8 mg/dL (ref 0.3–1.2)
Total Protein: 8 g/dL (ref 6.5–8.1)

## 2017-01-28 LAB — I-STAT TROPONIN, ED
TROPONIN I, POC: 0 ng/mL (ref 0.00–0.08)
Troponin i, poc: 0 ng/mL (ref 0.00–0.08)

## 2017-01-28 LAB — CBC
HCT: 42.4 % (ref 39.0–52.0)
Hemoglobin: 15.6 g/dL (ref 13.0–17.0)
MCH: 31.6 pg (ref 26.0–34.0)
MCHC: 36.8 g/dL — ABNORMAL HIGH (ref 30.0–36.0)
MCV: 85.8 fL (ref 78.0–100.0)
PLATELETS: 215 10*3/uL (ref 150–400)
RBC: 4.94 MIL/uL (ref 4.22–5.81)
RDW: 13 % (ref 11.5–15.5)
WBC: 9.3 10*3/uL (ref 4.0–10.5)

## 2017-01-28 LAB — LIPASE, BLOOD: LIPASE: 31 U/L (ref 11–51)

## 2017-01-28 MED ORDER — IOPAMIDOL (ISOVUE-370) INJECTION 76%
INTRAVENOUS | Status: AC
  Filled 2017-01-28: qty 100

## 2017-01-28 MED ORDER — IOPAMIDOL (ISOVUE-370) INJECTION 76%
100.0000 mL | Freq: Once | INTRAVENOUS | Status: AC | PRN
  Administered 2017-01-28: 100 mL via INTRAVENOUS

## 2017-01-28 MED ORDER — GI COCKTAIL ~~LOC~~
30.0000 mL | Freq: Once | ORAL | Status: AC
  Administered 2017-01-28: 30 mL via ORAL
  Filled 2017-01-28: qty 30

## 2017-01-28 MED ORDER — SUCRALFATE 1 GM/10ML PO SUSP: 1.0000 g | Freq: Three times a day (TID) | ORAL | 0 refills | Status: DC

## 2017-01-28 MED ORDER — FAMOTIDINE IN NACL 20-0.9 MG/50ML-% IV SOLN
20.0000 mg | Freq: Once | INTRAVENOUS | Status: AC
  Administered 2017-01-28: 20 mg via INTRAVENOUS
  Filled 2017-01-28: qty 50

## 2017-01-28 MED ORDER — PANTOPRAZOLE SODIUM 20 MG PO TBEC: 20.0000 mg | DELAYED_RELEASE_TABLET | Freq: Every day | ORAL | 0 refills | Status: DC

## 2017-01-28 MED ORDER — HYDROMORPHONE HCL 1 MG/ML IJ SOLN
1.0000 mg | Freq: Once | INTRAMUSCULAR | Status: AC
  Administered 2017-01-28: 1 mg via INTRAVENOUS
  Filled 2017-01-28: qty 1

## 2017-01-28 MED ORDER — ONDANSETRON HCL 4 MG/2ML IJ SOLN
4.0000 mg | Freq: Once | INTRAMUSCULAR | Status: AC
  Administered 2017-01-28: 4 mg via INTRAVENOUS
  Filled 2017-01-28: qty 2

## 2017-01-28 MED ORDER — SODIUM CHLORIDE 0.9 % IJ SOLN
INTRAMUSCULAR | Status: AC
  Filled 2017-01-28: qty 50

## 2017-01-28 MED ORDER — SODIUM CHLORIDE 0.9 % IV BOLUS (SEPSIS)
500.0000 mL | Freq: Once | INTRAVENOUS | Status: AC
  Administered 2017-01-28: 500 mL via INTRAVENOUS

## 2017-01-28 NOTE — ED Provider Notes (Signed)
[facility]-EMERGENCY DEPT Provider Note   CSN: [id number] Arrival date & time: 01/28/17  0411     History   Chief Complaint Chief Complaint  Patient presents with  . Chest Pain    HPI Christopher Middleton is a 51 y.o. male.  Patient presents to the emergency department for evaluation of chest pain. Patient reports that he was awakened at around 3 AM with back pain. He got up and walked around, felt somewhat better. He then became acutely nauseated. He went back to bed, and developed left sided chest pain. Patient continues to have pain in the left upper back area as well as pain in the left chest that is constant. He has not identified any exacerbating factors, feels better if he stands up and walks.      Past Medical History:  Diagnosis Date  . Depression   . Seizures [facility])     Patient Active Problem List   Diagnosis Date Noted  . 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 Seizure disorder, complex partial (HCC) 04/25/2014  . 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 Low back pain 10/07/2013  . Chest pain 10/07/2013  . Probable seizures 10/07/2013    Past Surgical History:  Procedure Laterality Date  . NO PAST SURGERIES         Home Medications    Prior to Admission medications   Medication Sig Start Date End Date Taking? Authorizing Provider  ibuprofen (ADVIL,MOTRIN) 200 MG tablet Take 400 mg by mouth every 6 (six) hours as needed for headache or moderate pain.   Yes Historical Provider, MD  levETIRAcetam (KEPPRA) 500 MG tablet Take 1 tablet (500 mg total) by mouth 2 (two) times daily. 01/02/17  Yes Nilda Riggs, NP  Multiple Vitamin (MULTIVITAMIN WITH MINERALS) TABS tablet Take 1 tablet by mouth daily.   Yes Historical Provider, MD  sertraline (ZOLOFT) 100 MG tablet Take 100 mg by mouth every evening.   Yes Historical Provider, MD  aspirin 81 MG tablet Take 1 tablet (81 mg total) by mouth daily. Patient not taking: Reported on 01/28/2017 10/08/13   Marinda Elk, MD    pantoprazole (PROTONIX) 20 MG tablet Take 1 tablet (20 mg total) by mouth daily. 01/28/17   Gilda Crease, MD  sucralfate (CARAFATE) 1 GM/10ML suspension Take 10 mLs (1 g total) by mouth 4 (four) times daily -  with meals and at bedtime. 01/28/17   Gilda Crease, MD    Family History Family History  Problem Relation Age of Onset  . Diabetes Mellitus II Mother   . Prostate cancer Paternal Grandfather   . Stroke Maternal Grandfather     Social History Social History  Substance Use Topics  . Smoking status: Never Smoker  . Smokeless tobacco: Never Used  . Alcohol use 0.0 oz/week     Comment: 3-4 week     Allergies   Patient has no known allergies.   Review of Systems Review of Systems  Cardiovascular: Positive for chest pain.  Musculoskeletal: Positive for back pain.  All other systems reviewed and are negative.    Physical Exam Updated Vital Signs BP 136/99 (BP Location: Left Arm)   Pulse (!) 55   Temp 98.1 F (36.7 C) (Oral)   Resp 14   Ht 6' (1.829 m)   Wt 200 lb (90.7 kg)   SpO2 100%   BMI 27.12 kg/m   Physical Exam  Constitutional: He is oriented to person, place, and time. He appears well-developed and well-nourished. He appears  distressed.  HENT:  Head: Normocephalic and atraumatic.  Right Ear: Hearing normal.  Left Ear: Hearing normal.  Nose: Nose normal.  Mouth/Throat: Oropharynx is clear and moist and mucous membranes are normal.  Eyes: Conjunctivae and EOM are normal. Pupils are equal, round, and reactive to light.  Neck: Normal range of motion. Neck supple.  Cardiovascular: Regular rhythm, S1 normal and S2 normal.  Exam reveals no gallop and no friction rub.   No murmur heard. Pulmonary/Chest: Effort normal and breath sounds normal. No respiratory distress. He exhibits no tenderness.  Abdominal: Soft. Normal appearance and bowel sounds are normal. There is no hepatosplenomegaly. There is no tenderness. There is no rebound, no  guarding, no tenderness at McBurney's point and negative Murphy's sign. No hernia.  Musculoskeletal: Normal range of motion.       Thoracic back: He exhibits tenderness.       Back:  Neurological: He is alert and oriented to person, place, and time. He has normal strength. No cranial nerve deficit or sensory deficit. Coordination normal. GCS eye subscore is 4. GCS verbal subscore is 5. GCS motor subscore is 6.  Skin: Skin is warm, dry and intact. No rash noted. No cyanosis.  Psychiatric: He has a normal mood and affect. His speech is normal and behavior is normal. Thought content normal.  Nursing note and vitals reviewed.    ED Treatments / Results  Labs (all labs ordered are listed, but only abnormal results are displayed) Labs Reviewed  CBC - Abnormal; Notable for the following:       Result Value   MCHC 36.8 (*)    All other components within normal limits  COMPREHENSIVE METABOLIC PANEL - Abnormal; Notable for the following:    Glucose, Bld 105 (*)    BUN 24 (*)    All other components within normal limits  LIPASE, BLOOD  I-STAT TROPOININ, ED  I-STAT TROPOININ, ED    EKG  EKG Interpretation  Date/Time:  Tuesday January 28 2017 04:19:41 EST Ventricular Rate:  66 PR Interval:    QRS Duration: 99 QT Interval:  387 QTC Calculation: 406 R Axis:   36 Text Interpretation:  Sinus rhythm Normal ECG Confirmed by POLLINA  MD, CHRISTOPHER ([id number]) on 01/28/2017 4:26:54 AM Also confirmed by Blinda Leatherwood  MD, CHRISTOPHER [phone]), editor [place]  CCT, BEVERLY ([id number])  on 01/28/2017 6:48:02 AM       Radiology Dg Chest 2 View  Result Date: 01/28/2017 CLINICAL DATA:  51 year old male with chest pain. EXAM: CHEST  2 VIEW COMPARISON:  Chest radiograph dated 11/15/2011 and thoracic spine radiograph dated 10/06/2013 FINDINGS: The heart size and mediastinal contours are within normal limits. Both lungs are clear. The visualized skeletal structures are unremarkable. IMPRESSION: No active  cardiopulmonary disease. Electronically Signed   By: Elgie Collard M.D.   On: 01/28/2017 05:54   Ct Angio Chest/abd/pel For Dissection W And/or Wo Contrast  Result Date: 01/28/2017 CLINICAL DATA:  Pt was awoken this a.m. With central chest pain, radiates to his back, wbc's 9.3, GFR >60, no prev ct's of chest, abdomin nor pelvis EXAM: CT ANGIOGRAPHY CHEST, ABDOMEN AND PELVIS TECHNIQUE: Multidetector CT imaging through the chest, abdomen and pelvis was performed using the standard protocol during bolus administration of intravenous contrast. Multiplanar reconstructed images and MIPs were obtained and reviewed to evaluate the vascular anatomy. CONTRAST:  100 ml isovue 370 COMPARISON:  None. FINDINGS: CTA CHEST FINDINGS Cardiovascular: Preferential opacification of the thoracic aorta. No evidence of thoracic aortic aneurysm or dissection.  Normal heart size. No pericardial effusion. Mediastinum/Nodes: No enlarged mediastinal, hilar, or axillary lymph nodes. Thyroid gland, trachea, and esophagus demonstrate no significant findings. Lungs/Pleura: Lungs are clear. No pleural effusion or pneumothorax. Dependent subsegmental atelectasis in the lower lobes. Musculoskeletal: No chest wall abnormality. No acute or significant osseous findings. Review of the MIP images confirms the above findings. CTA ABDOMEN AND PELVIS FINDINGS VASCULAR Aorta: Normal caliber aorta without aneurysm, dissection, vasculitis or significant stenosis. Celiac: Patent without evidence of aneurysm, dissection, vasculitis or significant stenosis. SMA: Patent without evidence of aneurysm, dissection, vasculitis or significant stenosis. 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 Renal arteries are patent without evidence of aneurysm, dissection, vasculitis, fibromuscular dysplasia or significant stenosis. IMA: Patent without evidence of aneurysm, dissection, vasculitis or significant stenosis. Inflow: Patent without evidence of aneurysm, dissection, vasculitis  or significant stenosis. Veins: Dedicated venous phase not obtained. Patent bilateral renal veins, splenic vein, portal vein. Review of the MIP images confirms the above findings. NON-VASCULAR Hepatobiliary: Fatty liver without focal lesion. No biliary ductal dilatation. Gallbladder nondilated without calcified stone. Pancreas: Unremarkable. No pancreatic ductal dilatation or surrounding inflammatory changes. Spleen: Normal in size without focal abnormality. Adrenals/Urinary Tract: Adrenal glands are unremarkable. 1 mm calculus, mid right renal collecting system. No hydronephrosis. No focal renal lesion. Bladder is physiologically distended, with a 1 mm calculus in its dependent portion. Stomach/Bowel: Stomach is within normal limits. Appendix appears normal. No evidence of bowel wall thickening, distention, or inflammatory changes. Lymphatic: No adenopathy localized Reproductive: Mild prostatic enlargement with central small calcification Other: No ascites.  No free air. Musculoskeletal: No acute or significant osseous findings. Review of the MIP images confirms the above findings. IMPRESSION: 1. Negative for aortic dissection or other significant vascular finding. 2. Right nephrolithiasis, and a 1 mm calculus in the lumen of the urinary bladder. 3. Fatty liver Electronically Signed   By: Corlis Leak M.D.   On: 01/28/2017 08:27    Procedures Procedures (including critical care time)  Medications Ordered in ED Medications  famotidine (PEPCID) IVPB 20 mg premix (0 mg Intravenous Stopped 01/28/17 0630)  gi cocktail (Maalox,Lidocaine,Donnatal) (30 mLs Oral Given 01/28/17 0511)  HYDROmorphone (DILAUDID) injection 1 mg (1 mg Intravenous Given 01/28/17 0511)  ondansetron (ZOFRAN) injection 4 mg (4 mg Intravenous Given 01/28/17 0512)  sodium chloride 0.9 % bolus 500 mL (0 mLs Intravenous Stopped 01/28/17 0630)  iopamidol (ISOVUE-370) 76 % injection 100 mL (100 mLs Intravenous Contrast Given 01/28/17 0755)      Initial Impression / Assessment and Plan / ED Course  I have reviewed the triage vital signs and the nursing notes.  Pertinent labs & imaging results that were available during my care of the patient were reviewed by me and considered in my medical decision making (see chart for details).     Presents with pain in the left side of his upper and middle back radiating into the chest. Patient does not have a cardiac history. He has minimal cardiac risk factors. He is felt to be low risk for cardiac etiology based on presentation and a HEART score of 1. EKG Normal, troponin normal.  Patient improved after IV Dilaudid, Pepcid, GI cocktail. Because of the presentation with chest and back pain, aortic dissection is considered. Patient underwent CT angiography of chest, abdomen, pelvis. No acute pathology was noted. Patient will be treated for reflux, follow-up with PCP.  Final Clinical Impressions(s) / ED Diagnoses   Final diagnoses:  Atypical chest pain    New Prescriptions Discharge Medication List as  of 01/28/2017  8:41 AM    START taking these medications   Details  pantoprazole (PROTONIX) 20 MG tablet Take 1 tablet (20 mg total) by mouth daily., Starting Tue 01/28/2017, Print    sucralfate (CARAFATE) 1 GM/10ML suspension Take 10 mLs (1 g total) by mouth 4 (four) times daily -  with meals and at bedtime., Starting Tue 01/28/2017, Print         Gilda Creasehristopher J Pollina, MD 01/29/17 [phone]

## 2017-01-28 NOTE — ED Triage Notes (Signed)
Awaken from sleep with central chest pain that radiates to his back

## 2017-01-31 DIAGNOSIS — Z1211 Encounter for screening for malignant neoplasm of colon: Secondary | ICD-10-CM | POA: Diagnosis not present

## 2017-01-31 DIAGNOSIS — N200 Calculus of kidney: Secondary | ICD-10-CM | POA: Diagnosis not present

## 2017-02-12 DIAGNOSIS — Z1211 Encounter for screening for malignant neoplasm of colon: Secondary | ICD-10-CM | POA: Diagnosis not present

## 2017-02-25 DIAGNOSIS — Z1211 Encounter for screening for malignant neoplasm of colon: Secondary | ICD-10-CM | POA: Diagnosis not present

## 2017-04-08 DIAGNOSIS — L309 Dermatitis, unspecified: Secondary | ICD-10-CM | POA: Diagnosis not present

## 2017-04-21 DIAGNOSIS — G4733 Obstructive sleep apnea (adult) (pediatric): Secondary | ICD-10-CM | POA: Diagnosis not present

## 2017-04-25 IMAGING — CT CT ANGIO CHEST-ABD-PELV FOR DISSECTION W/ AND WO/W CM
2 of 7 series · 15 of 46 positions shown, 17 images · IV contrast (ISOVUE 370)
Comparison: None.

CLINICAL DATA: Pt was awoken this a.m. With central chest pain,
radiates to his back, wbc's 9.3, GFR >60, no prev ct's of chest,
abdomin nor pelvis

EXAM:
CT ANGIOGRAPHY CHEST, ABDOMEN AND PELVIS
TECHNIQUE: Multidetector CT imaging through the chest, abdomen and pelvis was
performed using the standard protocol during bolus administration of
intravenous contrast. Multiplanar reconstructed images and MIPs were
obtained and reviewed to evaluate the vascular anatomy.
CONTRAST:  100 ml isovue 370

[Series 6: arterial 3.0 b30f · axial · arterial · 0.88mm/px · z∈[-322,+266]mm · 12 of 222 slices shown, 14 images]
[im 13/222  soft-tissue]
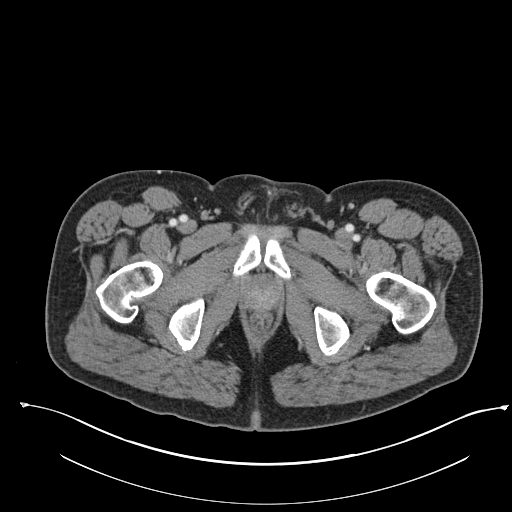
[im 13/222  bone]
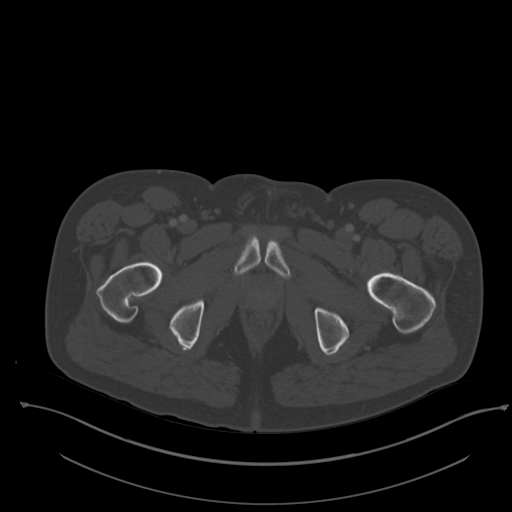
[im 37/222  soft-tissue]
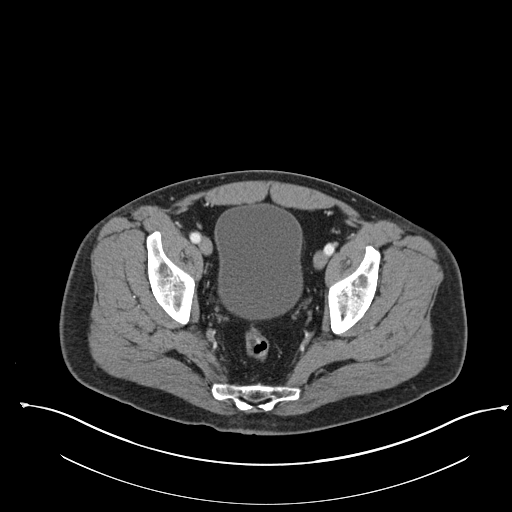
[im 50/222  soft-tissue]
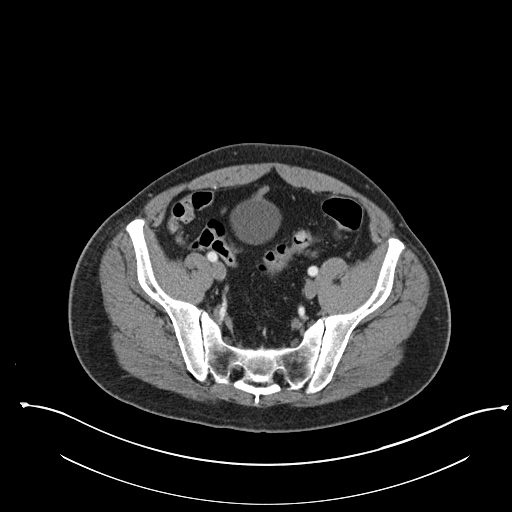
[im 62/222  soft-tissue]
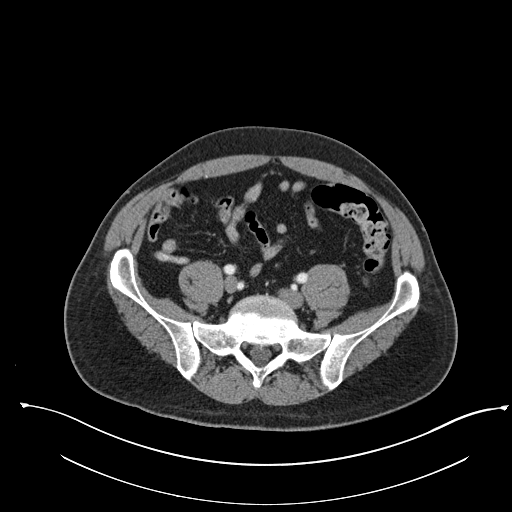
[im 86/222  soft-tissue]
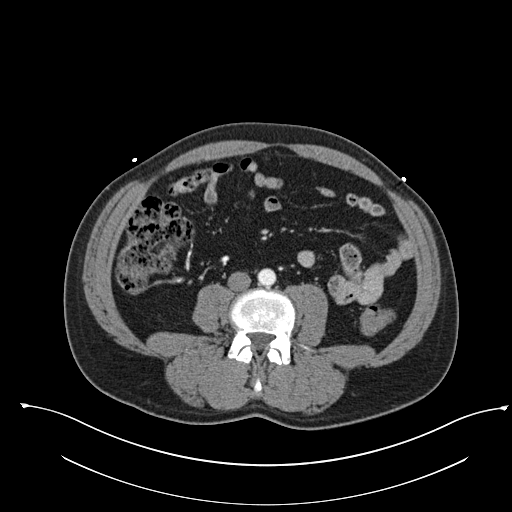
[im 99/222  soft-tissue]
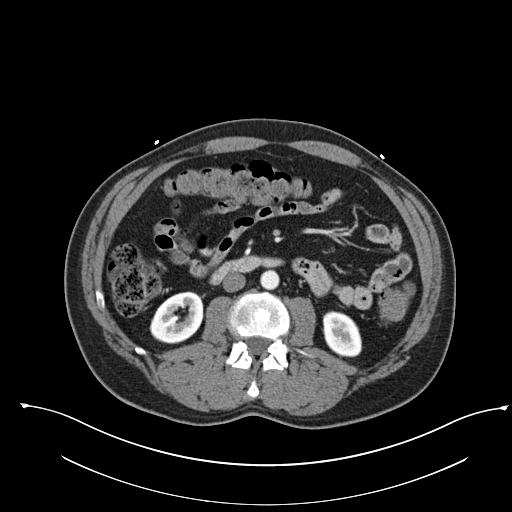
[im 123/222  soft-tissue]
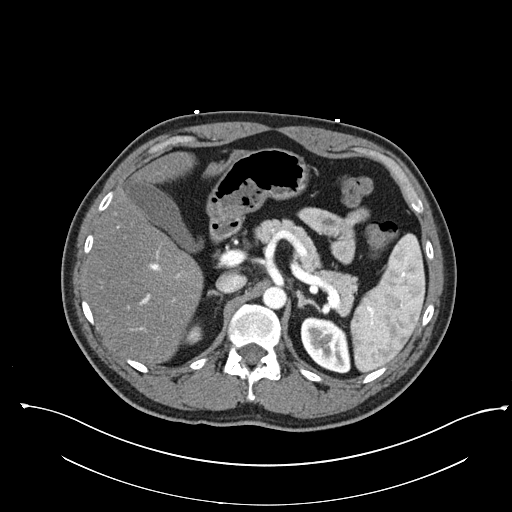
[im 136/222  soft-tissue]
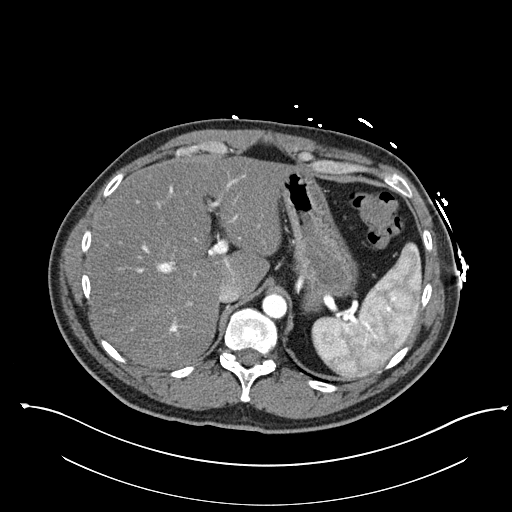
[im 160/222  soft-tissue]
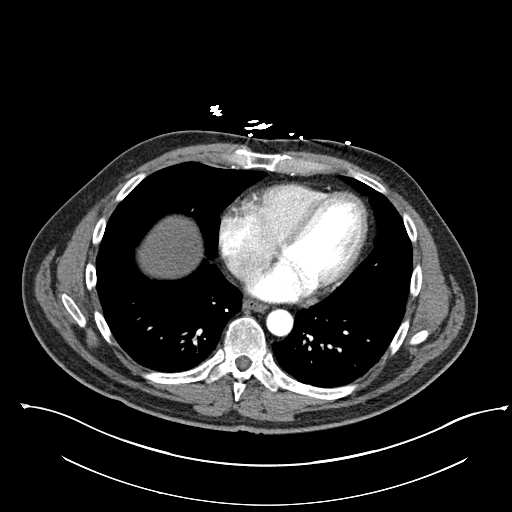
[im 160/222  bone]
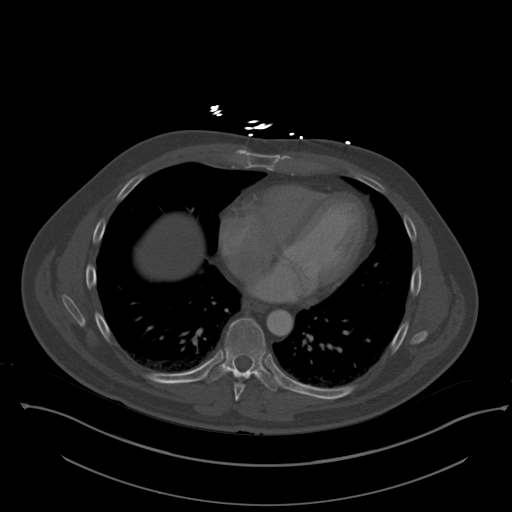
[im 172/222  soft-tissue]
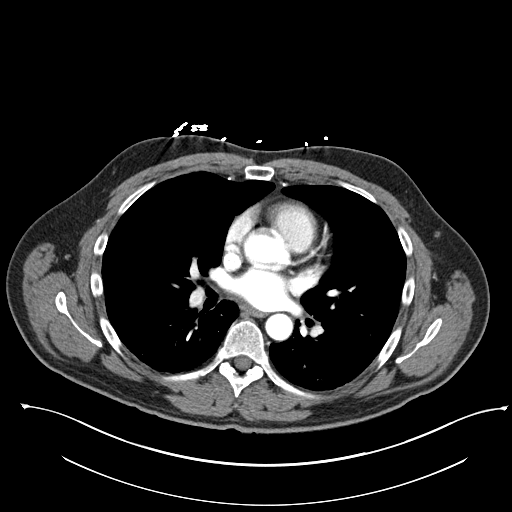
[im 185/222  soft-tissue]
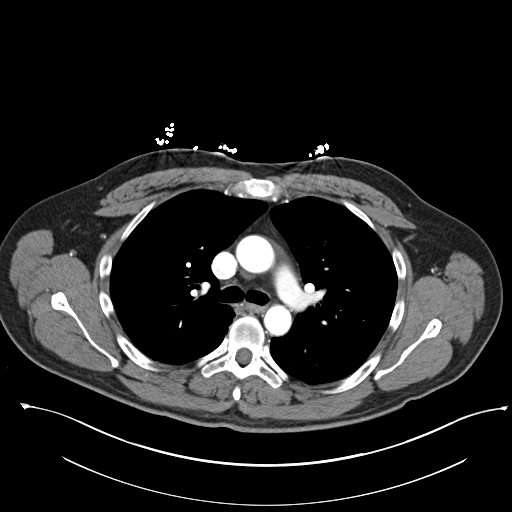
[im 209/222  soft-tissue]
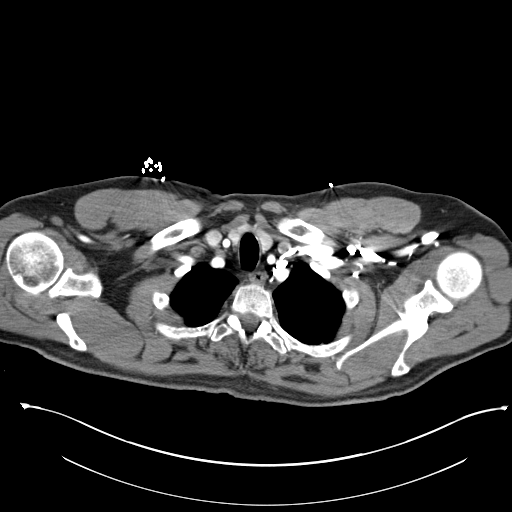

[Series 10: coronal mpr · coronal · 0.97mm/px · 3 of 157 slices shown]
[im 40/157  soft-tissue]
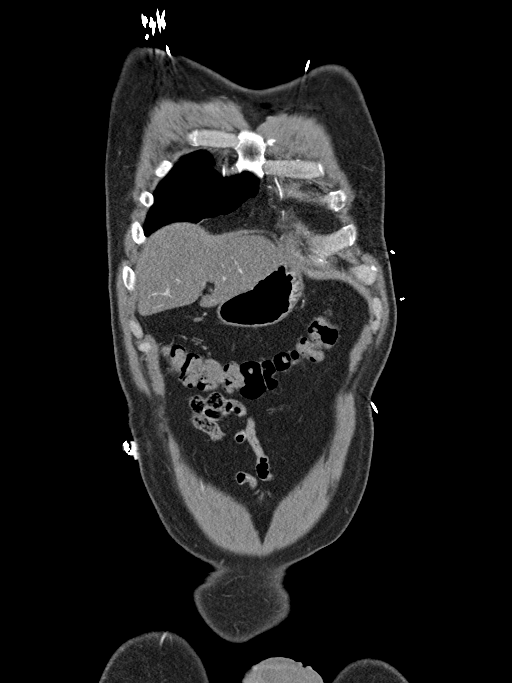
[im 79/157  soft-tissue]
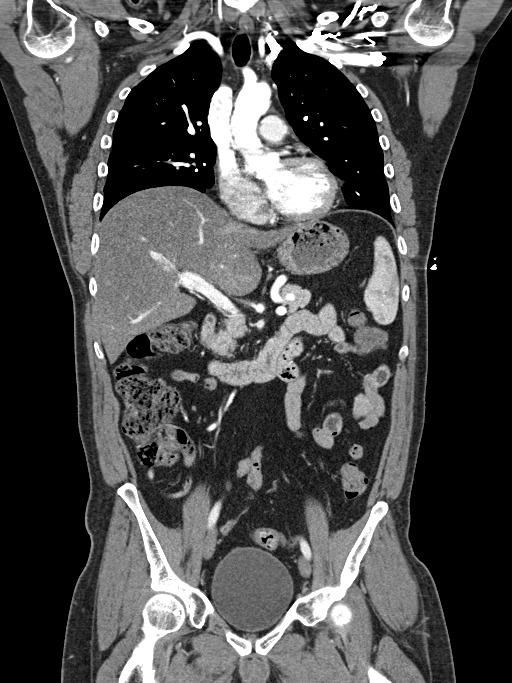
[im 118/157  soft-tissue]
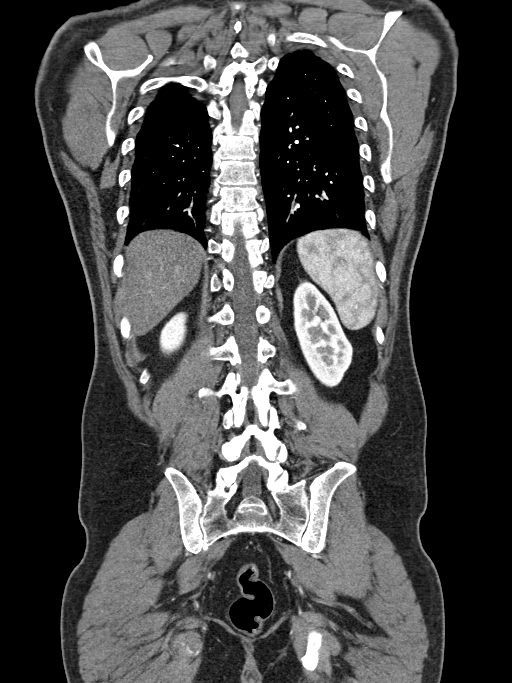

[15 of 46 positions shown; findings below may reference images not displayed]

FINDINGS: CTA CHEST FINDINGS

Cardiovascular: Preferential opacification of the thoracic aorta. No
evidence of thoracic aortic aneurysm or dissection. Normal heart
size. No pericardial effusion.

Mediastinum/Nodes: No enlarged mediastinal, hilar, or axillary lymph
nodes. Thyroid gland, trachea, and esophagus demonstrate no
significant findings.

Lungs/Pleura: Lungs are clear. No pleural effusion or pneumothorax.
Dependent subsegmental atelectasis in the lower lobes.

Musculoskeletal: No chest wall abnormality. No acute or significant
osseous findings.

Review of the MIP images confirms the above findings.

CTA ABDOMEN AND PELVIS FINDINGS

VASCULAR

Aorta: Normal caliber aorta without aneurysm, dissection, vasculitis
or significant stenosis.

Celiac: Patent without evidence of aneurysm, dissection, vasculitis
or significant stenosis.

SMA: Patent without evidence of aneurysm, dissection, vasculitis or
significant stenosis.

Renals: Duplicated on the left. Renal arteries are patent without
evidence of aneurysm, dissection, vasculitis, fibromuscular
dysplasia or significant stenosis.

IMA: Patent without evidence of aneurysm, dissection, vasculitis or
significant stenosis.

Inflow: Patent without evidence of aneurysm, dissection, vasculitis
or significant stenosis.

Veins: Dedicated venous phase not obtained. Patent bilateral renal
veins, splenic vein, portal vein.

Review of the MIP images confirms the above findings.

NON-VASCULAR

Hepatobiliary: Fatty liver without focal lesion. No biliary ductal
dilatation. Gallbladder nondilated without calcified stone.

Pancreas: Unremarkable. No pancreatic ductal dilatation or
surrounding inflammatory changes.

Spleen: Normal in size without focal abnormality.

Adrenals/Urinary Tract: Adrenal glands are unremarkable. 1 mm
calculus, mid right renal collecting system. No hydronephrosis. No
focal renal lesion. Bladder is physiologically distended, with a 1
mm calculus in its dependent portion.

Stomach/Bowel: Stomach is within normal limits. Appendix appears
normal. No evidence of bowel wall thickening, distention, or
inflammatory changes.

Lymphatic: No adenopathy localized

Reproductive: Mild prostatic enlargement with central small
calcification

Other: No ascites.  No free air.

Musculoskeletal: No acute or significant osseous findings.

Review of the MIP images confirms the above findings.
IMPRESSION: 1. Negative for aortic dissection or other significant vascular
finding.
2. Right nephrolithiasis, and a 1 mm calculus in the lumen of the
urinary bladder.
3. Fatty liver

## 2017-05-14 DIAGNOSIS — F310 Bipolar disorder, current episode hypomanic: Secondary | ICD-10-CM | POA: Diagnosis not present

## 2017-05-22 DIAGNOSIS — G4733 Obstructive sleep apnea (adult) (pediatric): Secondary | ICD-10-CM | POA: Diagnosis not present

## 2017-06-21 DIAGNOSIS — G4733 Obstructive sleep apnea (adult) (pediatric): Secondary | ICD-10-CM | POA: Diagnosis not present

## 2017-07-22 DIAGNOSIS — G4733 Obstructive sleep apnea (adult) (pediatric): Secondary | ICD-10-CM | POA: Diagnosis not present

## 2017-10-22 DIAGNOSIS — F310 Bipolar disorder, current episode hypomanic: Secondary | ICD-10-CM | POA: Diagnosis not present

## 2017-11-03 ENCOUNTER — Encounter (HOSPITAL_COMMUNITY): Payer: Self-pay | Admitting: Emergency Medicine

## 2017-11-03 ENCOUNTER — Emergency Department (HOSPITAL_COMMUNITY)
Admission: EM | Admit: 2017-11-03 | Discharge: 2017-11-03 | Disposition: A | Discharge status: Home / Self Care | Payer: BLUE CROSS/BLUE SHIELD | Attending: Emergency Medicine | Admitting: Emergency Medicine

## 2017-11-03 DIAGNOSIS — Y929 Unspecified place or not applicable: Secondary | ICD-10-CM | POA: Diagnosis not present

## 2017-11-03 DIAGNOSIS — Y999 Unspecified external cause status: Secondary | ICD-10-CM | POA: Insufficient documentation

## 2017-11-03 DIAGNOSIS — W541XXA Struck by dog, initial encounter: Secondary | ICD-10-CM | POA: Diagnosis not present

## 2017-11-03 DIAGNOSIS — S0502XA Injury of conjunctiva and corneal abrasion without foreign body, left eye, initial encounter: Secondary | ICD-10-CM

## 2017-11-03 DIAGNOSIS — Y9389 Activity, other specified: Secondary | ICD-10-CM | POA: Diagnosis not present

## 2017-11-03 DIAGNOSIS — S0592XA Unspecified injury of left eye and orbit, initial encounter: Secondary | ICD-10-CM | POA: Diagnosis not present

## 2017-11-03 MED ORDER — ERYTHROMYCIN 5 MG/GM OP OINT: TOPICAL_OINTMENT | OPHTHALMIC | 0 refills | Status: DC

## 2017-11-03 MED ORDER — FLUORESCEIN SODIUM 1 MG OP STRP
1.0000 | ORAL_STRIP | Freq: Once | OPHTHALMIC | Status: AC
  Administered 2017-11-03: 1 via OPHTHALMIC
  Filled 2017-11-03: qty 1

## 2017-11-03 MED ORDER — OXYCODONE-ACETAMINOPHEN 5-325 MG PO TABS: 1.0000 | ORAL_TABLET | Freq: Four times a day (QID) | ORAL | 0 refills | Status: DC | PRN

## 2017-11-03 MED ORDER — TETRACAINE HCL 0.5 % OP SOLN
2.0000 [drp] | Freq: Once | OPHTHALMIC | Status: AC
  Administered 2017-11-03: 2 [drp] via OPHTHALMIC
  Filled 2017-11-03: qty 4

## 2017-11-03 NOTE — ED Triage Notes (Signed)
Patient reports laying on the floor with his dog when the dogs paw went in patients left eye. Patient has difficulty opening eye. Reports blurry vision since injury. Swelling noted. Hx injury to same eye.

## 2017-11-03 NOTE — ED Notes (Signed)
Pt ambulatory and independent at discharge.  Verbalized understanding of discharge instructions 

## 2017-11-03 NOTE — Discharge Instructions (Signed)
You were seen in the ED today after an eye injury. There is likely a scrape on the surface of the eye that is very painful. We are starting some antibiotic ointment to apply to the left eye as directed. Use the pain medication only as needed for severe pain. Call your eye doctor in the AM to be seen tomorrow. I have attached the contact info of another eye doctor to call if needed.   Return to the ED with any severe pain, fever, chills, or worsening vision loss.

## 2017-11-03 NOTE — ED Provider Notes (Signed)
Emergency Department Provider Note   I have reviewed the triage vital signs and the nursing notes.   HISTORY  Chief Complaint Eye Injury   HPI Christopher SimmeringRobert S Middleton is a 51 y.o. male with PMH of seizure presents to the emergency department for evaluation of left eye pain and blurry vision.  The patient was playing with his dog when the dog's paw inadvertently struck him in the left eye.  He had immediate pain and blurry vision there.  Pain is worse with opening the eye.  He has light sensitivity.  He does not wear contact lenses.  He reports several prior corneal abrasions in the same he has an ophthalmologist to be follows with as an outpatient.    Past Medical History:  Diagnosis Date  . Depression   . Seizures [facility](HCC)     Patient Active Problem List   Diagnosis Date Noted  . 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 Seizure disorder, complex partial (HCC) 04/25/2014  . 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 Low back pain 10/07/2013  . Chest pain 10/07/2013  . Probable seizures 10/07/2013    Past Surgical History:  Procedure Laterality Date  . NO PAST SURGERIES      Current Outpatient Rx  . Order #: [id number] Class: Historical Med  . Order #: [id number] Class: Normal  . Order #: [id number] Class: Historical Med  . Order #: [id number] Class: Historical Med  . Order #: [id number] Class: OTC  . Order #: [id number] Class: Print  . Order #: [id number] Class: Print  . Order #: [id number] Class: Print  . Order #: [id number] Class: Print    Allergies Patient has no known allergies.  Family History  Problem Relation Age of Onset  . Diabetes Mellitus II Mother   . Prostate cancer Paternal Grandfather   . Stroke Maternal Grandfather     Social History Social History   Tobacco Use  . Smoking status: Never Smoker  . Smokeless tobacco: Never Used  Substance Use Topics  . Alcohol use: Yes    Alcohol/week: 0.0 oz    Comment: 3-4 week  . Drug use: No    Review of Systems  Constitutional: No  fever/chills Eyes: Blurry vision of left eye with severe pain.  ENT: No sore throat. Cardiovascular: Denies chest pain. Respiratory: Denies shortness of breath. Gastrointestinal: No abdominal pain.  No nausea, no vomiting.  No diarrhea.  No constipation. Genitourinary: Negative for dysuria. Musculoskeletal: Negative for back pain. Skin: Negative for rash. Neurological: Negative for headaches, focal weakness or numbness.  10-point ROS otherwise negative.  ____________________________________________   PHYSICAL EXAM:  VITAL SIGNS: ED Triage Vitals [11/03/17 2110]  Enc Vitals Group     BP (!) 157/93     Pulse Rate 71     Resp 16     Temp 97.6 F (36.4 C)     Temp Source Oral     SpO2 98 %     Weight 192 lb (87.1 kg)     Height 6' (1.829 m)   Constitutional: Alert and oriented. Appears very uncomfortable.  Eyes: Conjunctivae are slightly injected on the left. R:20/40 and L: 20/100. No clear abrasion on fluorescein. No clinical evidence to suggest globe rupture. Normal EOM. Some light sensitivity on exam of the left eye but Pupils are symmetrical and reactive.  Head: Atraumatic. Nose: No congestion/rhinnorhea. Mouth/Throat: Mucous membranes are moist.  Neck: No stridor. Cardiovascular: Good peripheral circulation.  Respiratory: Normal respiratory effort. Gastrointestinal: No distention.  Musculoskeletal: No lower extremity tenderness nor edema. No gross deformities of extremities. Neurologic:  Normal speech and language. No gross focal neurologic deficits are appreciated.  Skin:  Skin is warm, dry and intact. No rash noted.  ____________________________________________  RADIOLOGY  None ____________________________________________   PROCEDURES  Procedure(s) performed:   Procedures  None ____________________________________________   INITIAL IMPRESSION / ASSESSMENT AND PLAN / ED COURSE  Pertinent labs & imaging results that were available during my care of the  patient were reviewed by me and considered in my medical decision making (see chart for details).  Patient presents to the emergency department for evaluation of pain to the left eye after his dog struck him in the eye with their pop.  No large abrasion visualized on fluorescein staining.  No Seidel sign or other evidence of globe rupture. He has an ophthalmologist in the community who he will call tomorrow for an urgent appointment. Will start erythromycin ointment and pain meds for home.   At this time, I do not feel there is any life-threatening condition present. I have reviewed and discussed all results (EKG, imaging, lab, urine as appropriate), exam findings with patient. I have reviewed nursing notes and appropriate previous records.  I feel the patient is safe to be discharged home without further emergent workup. Discussed usual and customary return precautions. Patient and family (if present) verbalize understanding and are comfortable with this plan.  Patient will follow-up with their primary care provider. If they do not have a primary care provider, information for follow-up has been provided to them. All questions have been answered.  ____________________________________________  FINAL CLINICAL IMPRESSION(S) / ED DIAGNOSES  Final diagnoses:  Abrasion of left cornea, initial encounter     MEDICATIONS GIVEN DURING THIS VISIT:  Medications  tetracaine (PONTOCAINE) 0.5 % ophthalmic solution 2 drop (2 drops Left Eye Given 11/03/17 2252)  fluorescein ophthalmic strip 1 strip (1 strip Right Eye Given 11/03/17 2252)     NEW OUTPATIENT MEDICATIONS STARTED DURING THIS VISIT:  Erythromycin ointment and Percocet   Note:  This document was prepared using Dragon voice recognition software and may include unintentional dictation errors.  Alona BeneJoshua Keiley Levey, MD Emergency Medicine    Marsh Heckler, Arlyss RepressJoshua G, MD 11/04/17 [phone]

## 2017-11-04 DIAGNOSIS — S0502XA Injury of conjunctiva and corneal abrasion without foreign body, left eye, initial encounter: Secondary | ICD-10-CM | POA: Diagnosis not present
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
## 2017-11-26 ENCOUNTER — Encounter: Payer: Self-pay | Admitting: Nurse Practitioner

## 2017-11-26 ENCOUNTER — Ambulatory Visit: Payer: BLUE CROSS/BLUE SHIELD | Admitting: Nurse Practitioner

## 2017-11-26 VITALS — BP 129/83 | HR 63 | Ht 72.0 in | Wt 198.6 lb

## 2017-11-26 DIAGNOSIS — G40209 Localization-related (focal) (partial) symptomatic epilepsy and epileptic syndromes with complex partial seizures, not intractable, without status epilepticus: Secondary | ICD-10-CM | POA: Diagnosis not present

## 2017-11-26 MED ORDER — LEVETIRACETAM 500 MG PO TABS: 500.0000 mg | ORAL_TABLET | Freq: Two times a day (BID) | ORAL | 3 refills | Status: AC

## 2017-11-26 NOTE — Progress Notes (Signed)
I have reviewed and agreed above plan. 

## 2017-11-26 NOTE — Patient Instructions (Signed)
Continue Keppra at current dose will refill Call for seizure activity F/U yearly

## 2018-02-11 DIAGNOSIS — F310 Bipolar disorder, current episode hypomanic: Secondary | ICD-10-CM | POA: Diagnosis not present

## 2018-08-19 DIAGNOSIS — J069 Acute upper respiratory infection, unspecified: Secondary | ICD-10-CM | POA: Diagnosis not present

## 2018-10-28 DIAGNOSIS — F310 Bipolar disorder, current episode hypomanic: Secondary | ICD-10-CM | POA: Diagnosis not present
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
## 2018-11-26 ENCOUNTER — Telehealth: Payer: Self-pay | Admitting: *Deleted

## 2018-11-26 ENCOUNTER — Ambulatory Visit: Payer: BLUE CROSS/BLUE SHIELD | Admitting: Nurse Practitioner

## 2018-11-26 ENCOUNTER — Encounter: Payer: Self-pay | Admitting: Nurse Practitioner

## 2018-11-26 NOTE — Telephone Encounter (Signed)
Patient was no show for follow up with NP today.  

## 2019-08-11 ENCOUNTER — Other Ambulatory Visit: Payer: Self-pay | Admitting: Family Medicine

## 2019-08-11 DIAGNOSIS — N5089 Other specified disorders of the male genital organs: Secondary | ICD-10-CM

## 2019-08-12 ENCOUNTER — Ambulatory Visit
Admission: RE | Admit: 2019-08-12 | Discharge: 2019-08-12 | Disposition: A | Discharge status: Home / Self Care | Payer: BLUE CROSS/BLUE SHIELD | Source: Ambulatory Visit | Attending: Family Medicine | Admitting: Family Medicine

## 2019-08-12 DIAGNOSIS — N5089 Other specified disorders of the male genital organs: Secondary | ICD-10-CM

## 2019-11-07 IMAGING — US US SCROTUM
1 series · 13 of 25 positions shown · non-contrast
Comparison: None.

CLINICAL DATA: Palpable left testicular mass for 3 weeks.

EXAM:
ULTRASOUND OF SCROTUM
TECHNIQUE: Complete ultrasound examination of the testicles, epididymis, and
other scrotal structures was performed.

[Series 1: us scrotum · 0.05mm/px · 13 of 84 slices shown]
[im 1/84]
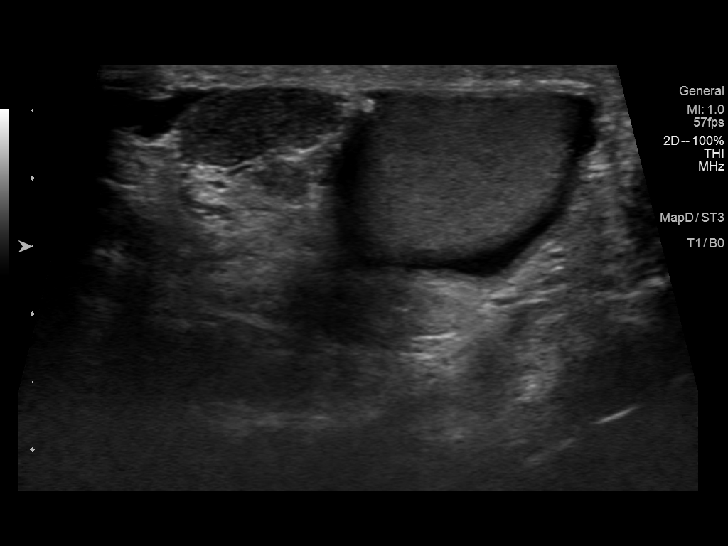
[im 7/84]
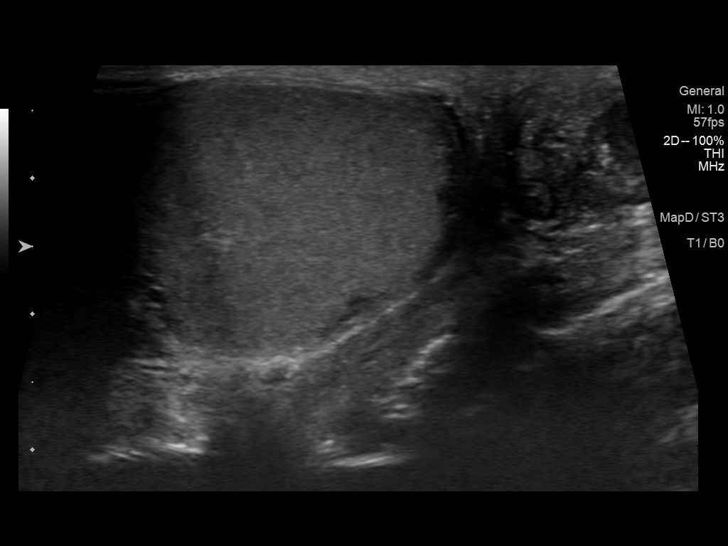
[im 14/84]
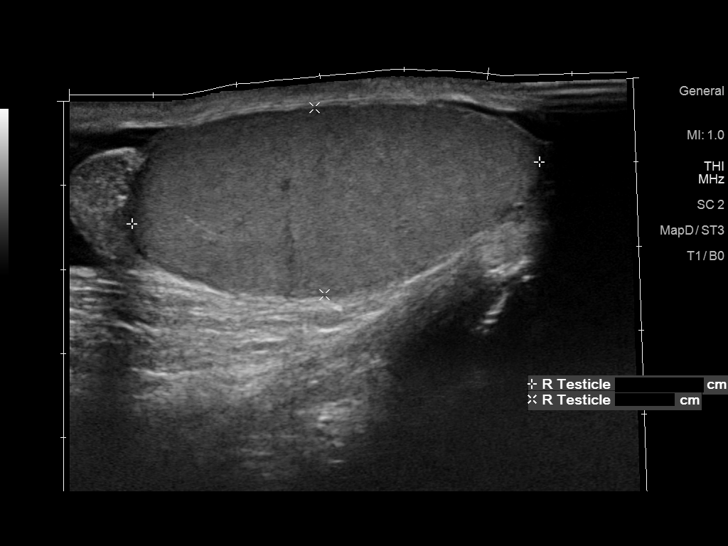
[im 21/84]
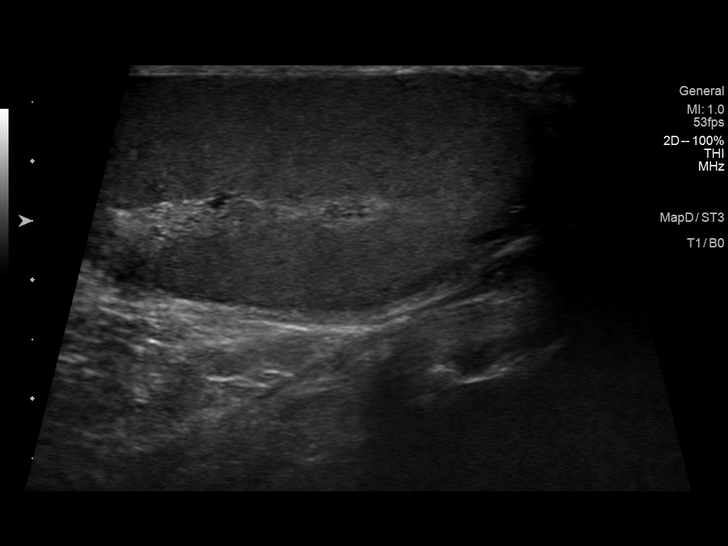
[im 28/84]
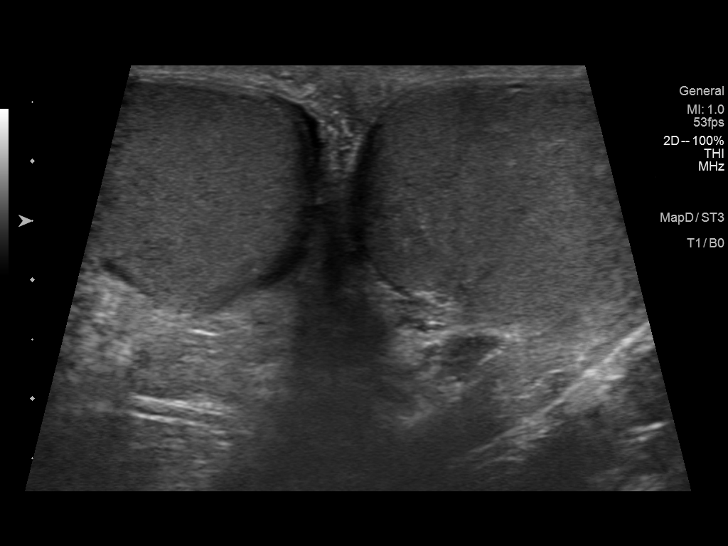
[im 35/84]
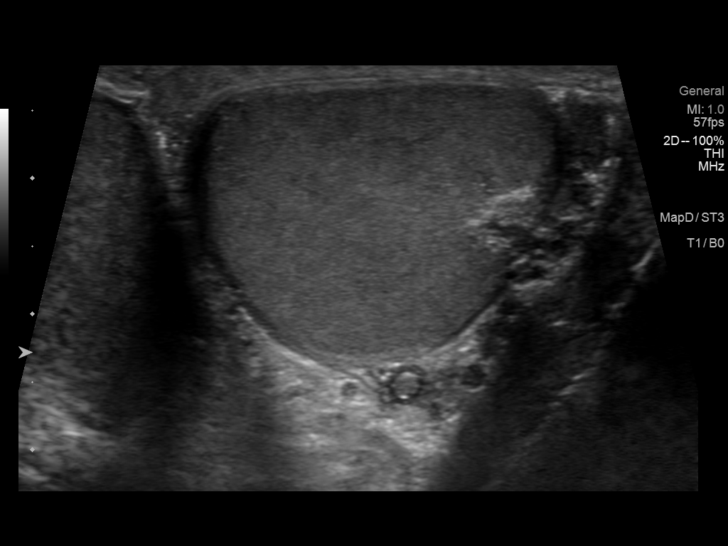
[im 42/84]
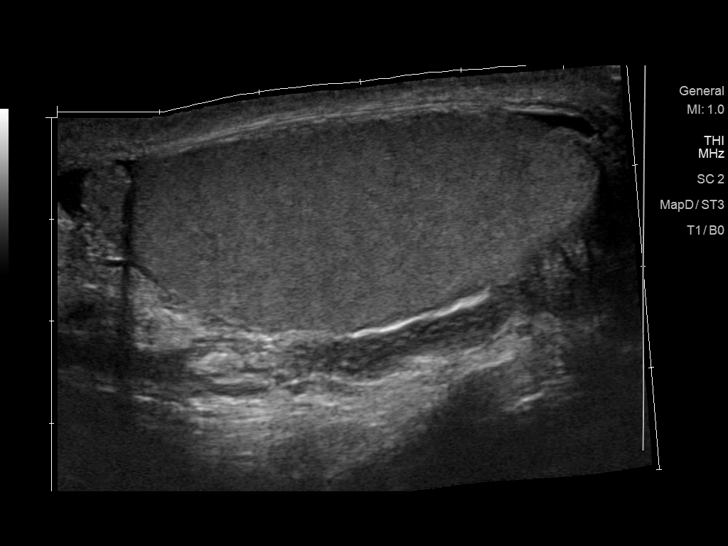
[im 49/84]
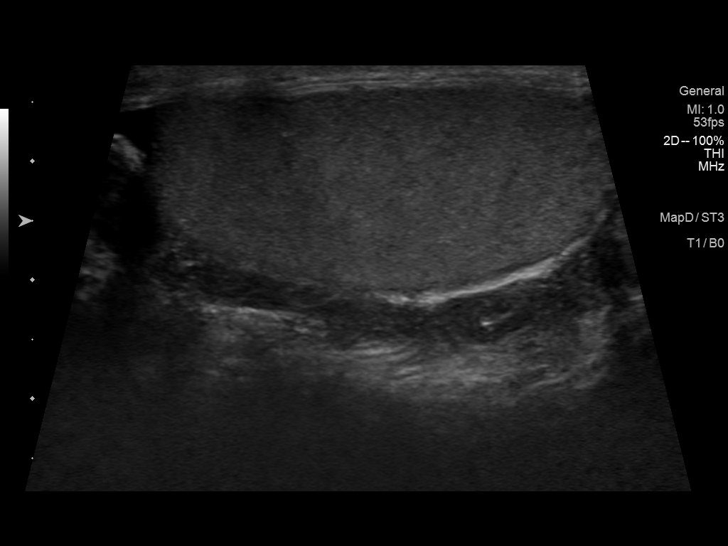
[im 56/84]
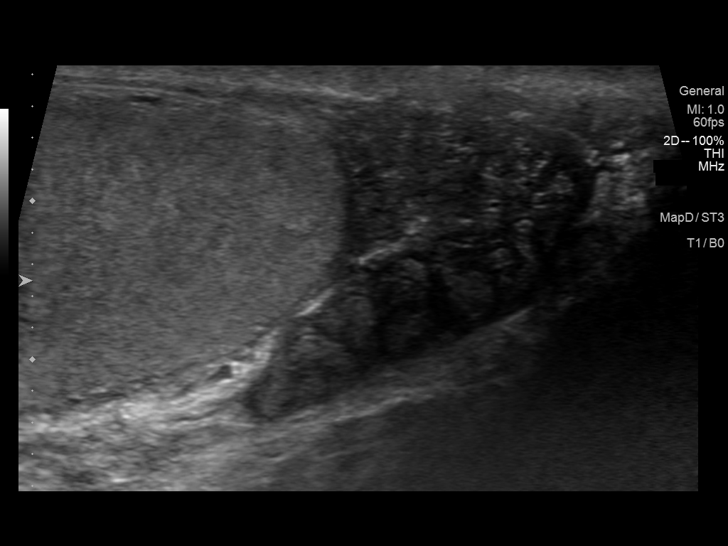
[im 63/84]
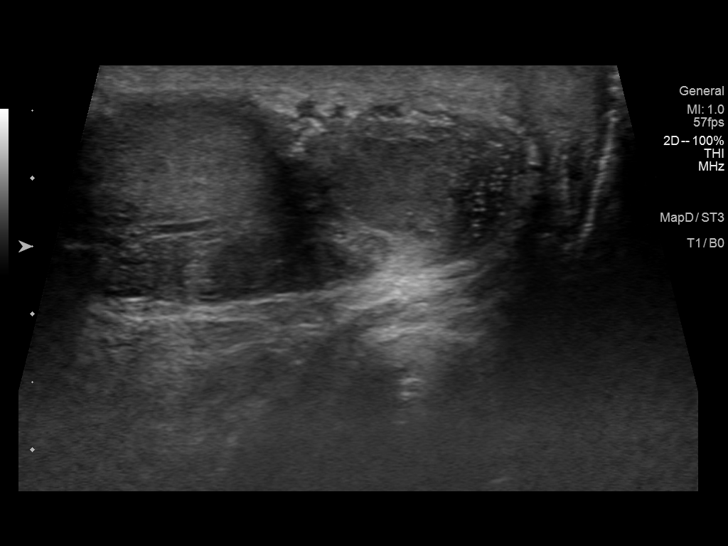
[im 70/84]
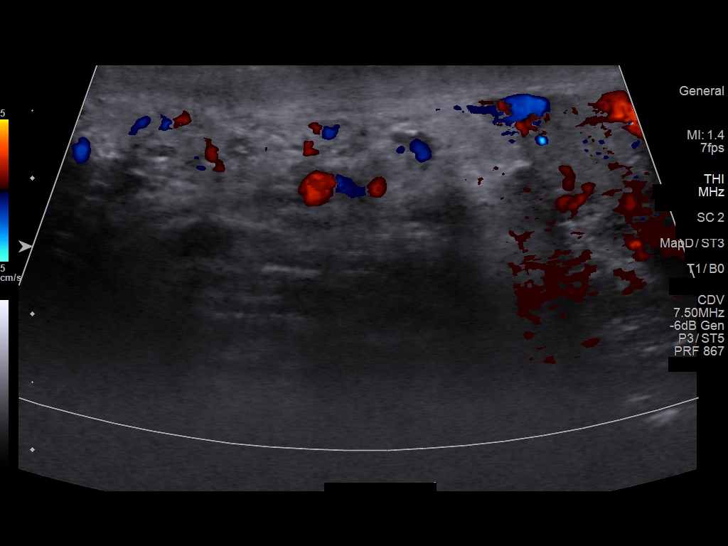
[im 77/84]
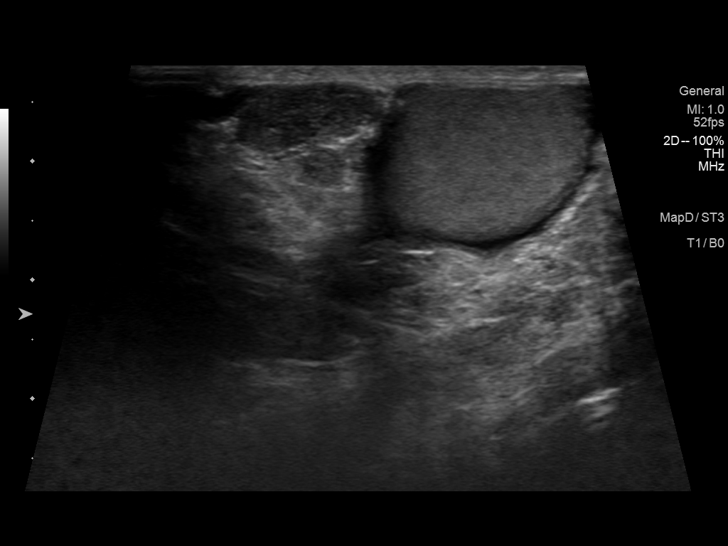
[im 84/84]
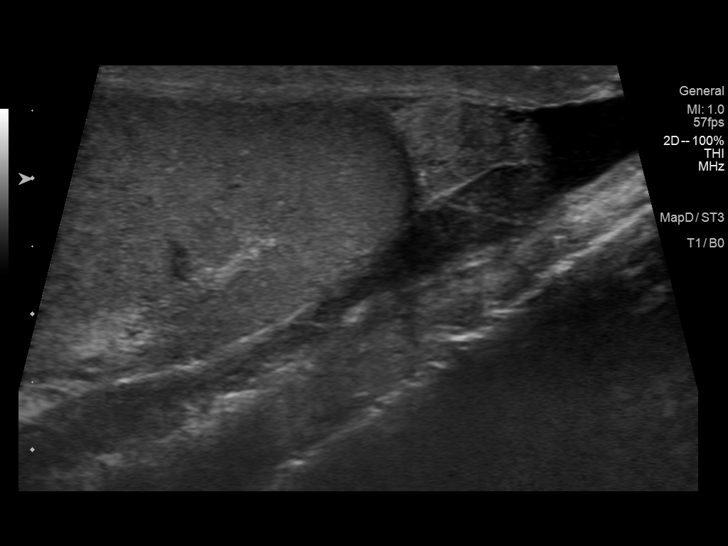

[13 of 25 positions shown; findings below may reference images not displayed]

FINDINGS: Right testicle

Measurements: 4.9 x 2.8 x 2.2 cm. No mass or microlithiasis
visualized. Color-flow Doppler demonstrates perfusion to the right
testicle.

Left testicle

Measurements: 4.7 x 2.7 x 2.0 cm. No mass or microlithiasis
visualized. Color-flow Doppler demonstrates perfusion to the left
testicle.

Right epididymis:  Normal in size and appearance.

Left epididymis: 8 mm rounded slightly hypoechoic abnormality is
seen within the epididymal tail that corresponds to the palpable
abnormality. It seems to have well-defined walls with some degree of
posterior acoustic enhancement suggesting some form of complex
cystic process. Differential includes complex epididymal cyst,
spermatocele, or tunica vaginalis cyst.

Hydrocele:  Small right hydrocele is noted.

Varicocele:  None visualized.
IMPRESSION: 8 mm rounded slightly hypoechoic abnormality is seen within the left
epididymal tail that corresponds to the palpable abnormality. Its
imaging characteristics are suggestive but not diagnostic for
complex cyst. The differential diagnosis includes complex epididymal
cyst, spermatocele or possibly tunica vaginalis cyst. Follow-up
ultrasound in 2-3 months is recommended to ensure resolution or
stability, and to rule out potential solid neoplasm.

The testicles are unremarkable.

Small right hydrocele is noted.
